# Patient Record
Sex: Female | Born: 1985 | Race: White | Hispanic: Yes | Marital: Single | Smoking: Never smoker
Health system: Southern US, Community
[De-identification: ages and names within clinical notes are randomized; demographics above are authoritative.]

## PROBLEM LIST (undated history)

## (undated) ENCOUNTER — Ambulatory Visit (HOSPITAL_BASED_OUTPATIENT_CLINIC_OR_DEPARTMENT_OTHER): Discharge status: Absent without leave | Payer: Self-pay

## (undated) DIAGNOSIS — Z789 Other specified health status: Secondary | ICD-10-CM

## (undated) HISTORY — PX: NO PAST SURGERIES: SHX2092

---

## 2013-03-28 ENCOUNTER — Other Ambulatory Visit (HOSPITAL_COMMUNITY): Payer: Self-pay | Admitting: Nurse Practitioner

## 2013-03-28 DIAGNOSIS — Z3689 Encounter for other specified antenatal screening: Secondary | ICD-10-CM

## 2013-03-28 LAB — OB RESULTS CONSOLE ANTIBODY SCREEN: Antibody Screen: NEGATIVE

## 2013-03-28 LAB — OB RESULTS CONSOLE RUBELLA ANTIBODY, IGM: RUBELLA: IMMUNE

## 2013-03-28 LAB — OB RESULTS CONSOLE VARICELLA ZOSTER ANTIBODY, IGG: Varicella: IMMUNE

## 2013-03-28 LAB — OB RESULTS CONSOLE RPR: RPR: NONREACTIVE

## 2013-03-28 LAB — OB RESULTS CONSOLE ABO/RH: RH Type: POSITIVE

## 2013-03-28 LAB — OB RESULTS CONSOLE GC/CHLAMYDIA
Chlamydia: NEGATIVE
Gonorrhea: NEGATIVE

## 2013-03-28 LAB — OB RESULTS CONSOLE HIV ANTIBODY (ROUTINE TESTING): HIV: NONREACTIVE

## 2013-03-28 LAB — OB RESULTS CONSOLE HEPATITIS B SURFACE ANTIGEN: HEP B S AG: NEGATIVE

## 2013-04-02 ENCOUNTER — Encounter (HOSPITAL_COMMUNITY): Payer: Self-pay

## 2013-04-02 ENCOUNTER — Ambulatory Visit (HOSPITAL_COMMUNITY): Admission: RE | Admit: 2013-04-02 | Payer: Medicaid Other | Source: Ambulatory Visit

## 2013-04-02 ENCOUNTER — Other Ambulatory Visit (HOSPITAL_COMMUNITY): Payer: Self-pay | Admitting: Nurse Practitioner

## 2013-04-02 ENCOUNTER — Ambulatory Visit (HOSPITAL_COMMUNITY)
Admission: RE | Admit: 2013-04-02 | Discharge: 2013-04-02 | Disposition: A | Discharge status: Home / Self Care | Payer: Medicaid Other | Source: Ambulatory Visit | Attending: Nurse Practitioner | Admitting: Nurse Practitioner

## 2013-04-02 DIAGNOSIS — Z3689 Encounter for other specified antenatal screening: Secondary | ICD-10-CM

## 2013-04-15 ENCOUNTER — Other Ambulatory Visit (HOSPITAL_COMMUNITY): Payer: Self-pay | Admitting: Nurse Practitioner

## 2013-04-15 DIAGNOSIS — Z3689 Encounter for other specified antenatal screening: Secondary | ICD-10-CM

## 2013-04-29 ENCOUNTER — Ambulatory Visit (HOSPITAL_COMMUNITY): Payer: Self-pay | Attending: Nurse Practitioner

## 2013-05-07 ENCOUNTER — Other Ambulatory Visit (HOSPITAL_COMMUNITY): Payer: Self-pay | Admitting: Family

## 2013-05-07 DIAGNOSIS — O3663X3 Maternal care for excessive fetal growth, third trimester, fetus 3: Secondary | ICD-10-CM

## 2013-05-21 ENCOUNTER — Ambulatory Visit (HOSPITAL_COMMUNITY)
Admission: RE | Admit: 2013-05-21 | Discharge: 2013-05-21 | Disposition: A | Discharge status: Home / Self Care | Payer: Medicaid Other | Source: Ambulatory Visit | Attending: Family | Admitting: Family

## 2013-05-21 DIAGNOSIS — O26849 Uterine size-date discrepancy, unspecified trimester: Secondary | ICD-10-CM | POA: Insufficient documentation

## 2013-06-03 LAB — OB RESULTS CONSOLE GC/CHLAMYDIA
Chlamydia: NEGATIVE
Gonorrhea: NEGATIVE

## 2013-06-03 LAB — OB RESULTS CONSOLE GBS: GBS: NEGATIVE

## 2013-06-24 ENCOUNTER — Encounter (HOSPITAL_COMMUNITY): Payer: Self-pay | Admitting: *Deleted

## 2013-06-24 ENCOUNTER — Inpatient Hospital Stay (HOSPITAL_COMMUNITY)
Admission: EM | Admit: 2013-06-24 | Discharge: 2013-06-25 | DRG: 774 | Disposition: A | Discharge status: Home / Self Care | Payer: Medicaid Other | Attending: Family Medicine | Admitting: Family Medicine

## 2013-06-24 DIAGNOSIS — Z3490 Encounter for supervision of normal pregnancy, unspecified, unspecified trimester: Secondary | ICD-10-CM

## 2013-06-24 HISTORY — DX: Other specified health status: Z78.9

## 2013-06-24 LAB — COMPREHENSIVE METABOLIC PANEL
ALT: 22 U/L (ref 0–35)
AST: 25 U/L (ref 0–37)
Albumin: 2.1 g/dL — ABNORMAL LOW (ref 3.5–5.2)
Alkaline Phosphatase: 154 U/L — ABNORMAL HIGH (ref 39–117)
BUN: 4 mg/dL — AB (ref 6–23)
CHLORIDE: 106 meq/L (ref 96–112)
CO2: 20 meq/L (ref 19–32)
CREATININE: 0.51 mg/dL (ref 0.50–1.10)
Calcium: 8.1 mg/dL — ABNORMAL LOW (ref 8.4–10.5)
GLUCOSE: 91 mg/dL (ref 70–99)
Potassium: 3.6 mEq/L — ABNORMAL LOW (ref 3.7–5.3)
Sodium: 139 mEq/L (ref 137–147)
Total Protein: 6 g/dL (ref 6.0–8.3)

## 2013-06-24 LAB — CBC WITH DIFFERENTIAL/PLATELET
Basophils Absolute: 0 10*3/uL (ref 0.0–0.1)
Basophils Relative: 0 % (ref 0–1)
Eosinophils Absolute: 0.1 10*3/uL (ref 0.0–0.7)
Eosinophils Relative: 1 % (ref 0–5)
HEMATOCRIT: 33 % — AB (ref 36.0–46.0)
HEMOGLOBIN: 10.3 g/dL — AB (ref 12.0–15.0)
LYMPHS ABS: 1.9 10*3/uL (ref 0.7–4.0)
LYMPHS PCT: 20 % (ref 12–46)
MCH: 23 pg — ABNORMAL LOW (ref 26.0–34.0)
MCHC: 31.2 g/dL (ref 30.0–36.0)
MCV: 73.7 fL — ABNORMAL LOW (ref 78.0–100.0)
Monocytes Absolute: 0.8 10*3/uL (ref 0.1–1.0)
Monocytes Relative: 9 % (ref 3–12)
NEUTROS ABS: 6.5 10*3/uL (ref 1.7–7.7)
Neutrophils Relative %: 70 % (ref 43–77)
Platelets: 268 10*3/uL (ref 150–400)
RBC: 4.48 MIL/uL (ref 3.87–5.11)
RDW: 17 % — ABNORMAL HIGH (ref 11.5–15.5)
WBC: 9.3 10*3/uL (ref 4.0–10.5)

## 2013-06-24 LAB — TYPE AND SCREEN
ABO/RH(D): O POS
ANTIBODY SCREEN: NEGATIVE

## 2013-06-24 LAB — RPR: RPR Ser Ql: NONREACTIVE

## 2013-06-24 MED ORDER — WITCH HAZEL-GLYCERIN EX PADS: 1.0000 "application " | MEDICATED_PAD | CUTANEOUS | Status: DC | PRN

## 2013-06-24 MED ORDER — SIMETHICONE 80 MG PO CHEW: 80.0000 mg | CHEWABLE_TABLET | ORAL | Status: DC | PRN

## 2013-06-24 MED ORDER — ZOLPIDEM TARTRATE 5 MG PO TABS: 5.0000 mg | ORAL_TABLET | Freq: Every evening | ORAL | Status: DC | PRN

## 2013-06-24 MED ORDER — OXYTOCIN 40 UNITS IN LACTATED RINGERS INFUSION - SIMPLE MED
250.0000 mL/h | INTRAVENOUS | Status: DC
  Administered 2013-06-24: 250 mL/h via INTRAVENOUS
  Filled 2013-06-24: qty 1000

## 2013-06-24 MED ORDER — SENNOSIDES-DOCUSATE SODIUM 8.6-50 MG PO TABS
2.0000 | ORAL_TABLET | ORAL | Status: DC
Start: 2013-06-25 — End: 2013-06-25
  Administered 2013-06-25: 2 via ORAL
  Filled 2013-06-24: qty 2

## 2013-06-24 MED ORDER — FENTANYL CITRATE 0.05 MG/ML IJ SOLN
INTRAMUSCULAR | Status: AC
  Filled 2013-06-24: qty 2

## 2013-06-24 MED ORDER — PRENATAL MULTIVITAMIN CH
1.0000 | ORAL_TABLET | Freq: Every day | ORAL | Status: DC
  Administered 2013-06-24 – 2013-06-25 (×2): 1 via ORAL
  Filled 2013-06-24 (×2): qty 1

## 2013-06-24 MED ORDER — MISOPROSTOL 200 MCG PO TABS
800.0000 ug | ORAL_TABLET | Freq: Once | ORAL | Status: AC
  Administered 2013-06-24: 800 ug via VAGINAL
  Filled 2013-06-24: qty 4

## 2013-06-24 MED ORDER — FENTANYL CITRATE 0.05 MG/ML IJ SOLN
50.0000 ug | Freq: Once | INTRAMUSCULAR | Status: AC
  Administered 2013-06-24: 50 ug via INTRAVENOUS

## 2013-06-24 MED ORDER — OXYCODONE-ACETAMINOPHEN 5-325 MG PO TABS
2.0000 | ORAL_TABLET | Freq: Once | ORAL | Status: AC
  Administered 2013-06-24: 2 via ORAL
  Filled 2013-06-24: qty 2

## 2013-06-24 MED ORDER — DIPHENHYDRAMINE HCL 25 MG PO CAPS: 25.0000 mg | ORAL_CAPSULE | Freq: Four times a day (QID) | ORAL | Status: DC | PRN

## 2013-06-24 MED ORDER — OXYTOCIN 10 UNIT/ML IJ SOLN
INTRAMUSCULAR | Status: DC | PRN
  Administered 2013-06-24: 40 [IU]

## 2013-06-24 MED ORDER — DIBUCAINE 1 % RE OINT
1.0000 | TOPICAL_OINTMENT | RECTAL | Status: DC | PRN
Start: 2013-06-24 — End: 2013-06-25

## 2013-06-24 MED ORDER — LANOLIN HYDROUS EX OINT: TOPICAL_OINTMENT | CUTANEOUS | Status: DC | PRN

## 2013-06-24 MED ORDER — OXYCODONE-ACETAMINOPHEN 5-325 MG PO TABS
1.0000 | ORAL_TABLET | ORAL | Status: DC | PRN
  Administered 2013-06-24 (×2): 1 via ORAL
  Administered 2013-06-24: 2 via ORAL
  Administered 2013-06-25: 1 via ORAL
  Filled 2013-06-24 (×3): qty 1
  Filled 2013-06-24: qty 2

## 2013-06-24 MED ORDER — BENZOCAINE-MENTHOL 20-0.5 % EX AERO: 1.0000 "application " | INHALATION_SPRAY | CUTANEOUS | Status: DC | PRN

## 2013-06-24 MED ORDER — ONDANSETRON HCL 4 MG/2ML IJ SOLN
4.0000 mg | INTRAMUSCULAR | Status: DC | PRN
Start: 2013-06-24 — End: 2013-06-25

## 2013-06-24 MED ORDER — OXYTOCIN 40 UNITS IN LACTATED RINGERS INFUSION - SIMPLE MED
62.5000 mL/h | INTRAVENOUS | Status: DC
  Administered 2013-06-24: 62.5 mL/h via INTRAVENOUS
  Filled 2013-06-24 (×2): qty 1000

## 2013-06-24 MED ORDER — TETANUS-DIPHTH-ACELL PERTUSSIS 5-2.5-18.5 LF-MCG/0.5 IM SUSP
0.5000 mL | Freq: Once | INTRAMUSCULAR | Status: AC
  Administered 2013-06-24: 0.5 mL via INTRAMUSCULAR
  Filled 2013-06-24: qty 0.5

## 2013-06-24 MED ORDER — ONDANSETRON HCL 4 MG PO TABS: 4.0000 mg | ORAL_TABLET | ORAL | Status: DC | PRN

## 2013-06-24 MED ORDER — IBUPROFEN 600 MG PO TABS
600.0000 mg | ORAL_TABLET | Freq: Four times a day (QID) | ORAL | Status: DC
  Administered 2013-06-24 – 2013-06-25 (×5): 600 mg via ORAL
  Filled 2013-06-24 (×5): qty 1

## 2013-06-24 NOTE — ED Notes (Signed)
Patient leaving for [name] at this time.

## 2013-06-24 NOTE — ED Notes (Signed)
Baby born at this time by Dr Jodi MourningZavitz.

## 2013-06-24 NOTE — Lactation Note (Signed)
This note was copied from the chart of Rebecca English. Lactation Consultation Note  Patient Name: Rebecca English Today's Date: 06/24/2013 Reason for consult: Initial assessment Marly, the Spanish Interpreter present for visit. Mom reports baby is nursing well, denies questions or concerns. Basics reviewed. Lactation brochure left for review. Advised of OP services and support group. Mom reports she plans to breastfeed only till returning to work. Advised to ask for assist as needed.   Maternal Data Infant to breast within first hour of birth: Yes Has patient been taught Hand Expression?: Yes Does the patient have breastfeeding experience prior to this delivery?: Yes  Feeding Feeding Type: Breast Fed Length of feed: 10 min  LATCH Score/Interventions                      Lactation Tools Discussed/Used     Consult Status Consult Status: PRN Date: 06/25/13 Follow-up type: In-patient    Alfred LevinsGranger, Damarkus Balis Ann 06/24/2013, 7:05 PM

## 2013-06-24 NOTE — H&P (Signed)
Attestation of Attending Supervision of Advanced Practitioner (PA/CNM/NP): Evaluation and management procedures were performed by the Advanced Practitioner under my supervision and collaboration.  I have reviewed the Advanced Practitioner's note and chart, and I agree with the management and plan.  Reva BoresPRATT,Greenly Rarick S, MD [facility] Attending 06/24/2013 11:20 AM

## 2013-06-24 NOTE — MAU Note (Signed)
Off and on contractions since Friday, started this morning 0330.

## 2013-06-24 NOTE — ED Notes (Signed)
Patient coming through triage in active labor.  Patient is non english speaking, 4th child.  Patient on fetal monitor, baby HR of 160.

## 2013-06-24 NOTE — MAU Note (Signed)
[facility]. Pt arrived via carelink. [facility].  2nd IV started by carelink.  Bleeding controlled on arrival. C/o cramping in abd.

## 2013-06-24 NOTE — H&P (Signed)
Rebecca PetersGloria Mendoza English is a 28 y.o. female [phone] presenting at 719w1d [facility]. Following delivery patient had moderate- heavy postpartum bleeding; 80 units total of pitocin given via IV and cytotec given 800 mcg rectal. Patient presented to [name] in stable condition.   [facility] with late entry at 28wks. Hx LGA with P1.   Maternal Medical History:  Reason for admission: Nausea. See above ([facility])  Prenatal complications: no prenatal complications Prenatal Complications - Diabetes: none.    OB History   Grav Para Term Preterm Abortions TAB SAB Ect Mult Living   5 4 4  0 1 0 1 0 0 5     Past Medical History  Diagnosis Date  . Medical history non-contributory    Past Surgical History  Procedure Laterality Date  . No past surgeries     Family History: family history includes Diabetes in her paternal grandmother. There is no history of Hearing loss. Social History:  reports that she has never smoked. She has never used smokeless tobacco. She reports that she does not drink alcohol or use illicit drugs.   Prenatal Transfer Tool  Maternal Diabetes: No Genetic Screening: Declined Maternal Ultrasounds/Referrals: Normal Fetal Ultrasounds or other Referrals:  None Maternal Substance Abuse:  No Significant Maternal Medications:  None Significant Maternal Lab Results:  Lab values include: Group B Strep negative Other Comments:  None  Review of Systems  Constitutional: Negative for fever.  Respiratory: Negative for cough.   Gastrointestinal: Negative for nausea and vomiting.  Genitourinary: Negative for dysuria.  Neurological: Negative for headaches.      Blood pressure 121/77, pulse 84, temperature 98.9 F (37.2 C), temperature source Oral, resp. rate 18, SpO2 100.00%, unknown if currently breastfeeding. Maternal Exam:  Uterine Assessment: N/a   Abdomen: Patient reports no abdominal tenderness. n/a  Introitus:  deferred  Pelvis: adequate for delivery.   Cervix: N/a     Fetal Exam Fetal Monitor Review: n/a  Fetal State Assessment: n/a  Physical Exam  Constitutional: She is oriented to person, place, and time. She appears well-developed and well-nourished. No distress.  HENT:  Head: Normocephalic.  Neck: Normal range of motion. No thyromegaly present.  Cardiovascular: Normal rate and regular rhythm.   Respiratory: Effort normal and breath sounds normal.  GI:  Early PP. FF  Musculoskeletal: Normal range of motion.  Neurological: She is alert and oriented to person, place, and time.  Skin: Skin is warm and dry.    Prenatal labs: ABO, Rh: --/--/O POS (01/19 0730) Antibody: NEG (01/19 0730) Rubella: Immune (10/23 0000) RPR: Nonreactive (10/23 0000)  HBsAg: Negative (10/23 0000)  HIV: Non-reactive (10/23 0000)  GBS: Negative (12/29 0000)  1 hr glucola  86  Assessment/Plan: [id number] [facility]  PPD#0 stable   Red Mandt 06/24/2013, 10:08 AM

## 2013-06-24 NOTE — ED Notes (Signed)
Report given to Renae FicklePaul, Charity fundraiserN from [facility].  Patient transferred to [name] at this time.

## 2013-06-24 NOTE — ED Notes (Signed)
OB RR RN in room at this time.

## 2013-06-24 NOTE — Progress Notes (Signed)
This was with [phone] check

## 2013-06-24 NOTE — MAU Provider Note (Signed)
Attestation of Attending Supervision of Advanced Practitioner (PA/CNM/NP): Evaluation and management procedures were performed by the Advanced Practitioner under my supervision and collaboration.  I have reviewed the Advanced Practitioner's note and chart, and I agree with the management and plan.  Myli Pae S, MD [facility] Attending 06/24/2013 11:20 AM   

## 2013-06-24 NOTE — MAU Provider Note (Signed)
Subjective:  Ms. Rebecca English is a 28 y.o. female [phone]; pt presents [name]. Following delivery patient had moderate- heavy postpartum bleeding; 80 units total of pitocin given via IV and cytotec given 800 mcg rectal [name]. Patient presented to [name] in stable condition.    Objective:  GENERAL: Well-developed, well-nourished female in no acute distress.  HEENT: Normocephalic, atraumatic.   LUNGS: Effort normal HEART: Regular rate  ABDOMEN: fundus below umbilicus, soft; minimal vaginal bleeding with palpation  SKIN: Warm, dry, Pale and without erythema PSYCH: Normal mood and affect, spanish speaking   Filed Vitals:   06/24/13 0859  BP: 121/77  Pulse: 84  Temp:   Resp: 18    MDM: 2 percocet given in [name] IV intact  D. Poe CNM notified at 0730 of patient arrival.   A:  Status post vaginal delivery at [phone]; patient to be admitted to mother baby; infant to nursery.   Iona HansenJennifer Irene Gaylon Bentz, NP. 06/24/2013 10:11 AM

## 2013-06-24 NOTE — ED Provider Notes (Signed)
CSN: [id number]     Arrival date & time 06/24/13  1191 History   First MD Initiated Contact with Patient 06/24/13 [phone]     Chief Complaint  Patient presents with  . Laboring   (Consider location/radiation/quality/duration/timing/severity/associated sxs/prior Treatment) HPI Comments: 28 yo female Spanish speaking presents in active labor in our ED, no known pregnancy issues, she has had prenatal care locally, unsure her OB name.  No bleeding or fluid felt on arrival.  Unsure GBS.  Contractions regular, pressure, nothing improves.  Husband with patient.  No issues with previous vaginal deliveries.   The history is provided by the patient.    Past Medical History  Diagnosis Date  . Medical history non-contributory    Past Surgical History  Procedure Laterality Date  . No past surgeries     Family History  Problem Relation Age of Onset  . Diabetes Paternal Grandmother   . Hearing loss Neg Hx    History  Substance Use Topics  . Smoking status: Never Smoker   . Smokeless tobacco: Never Used  . Alcohol Use: No   OB History   Grav Para Term Preterm Abortions TAB SAB Ect Mult Living   5 4 4  0 1 0 1 0 0 5     Review of Systems  Constitutional: Negative for fever and chills.  HENT: Negative for congestion.   Eyes: Negative for visual disturbance.  Respiratory: Negative for shortness of breath.   Cardiovascular: Negative for chest pain.  Gastrointestinal: Positive for nausea and abdominal pain. Negative for vomiting.  Genitourinary: Negative for dysuria and flank pain.  Musculoskeletal: Positive for back pain. Negative for neck stiffness.  Skin: Negative for rash.  Neurological: Negative for light-headedness and headaches.    Allergies  Review of patient's allergies indicates no known allergies.  Home Medications  No current outpatient prescriptions on file. BP 121/77  Pulse 84  Temp(Src) 98.9 F (37.2 C) (Oral)  Resp 18  SpO2 100% Physical Exam  Nursing note and  vitals reviewed. Constitutional: She is oriented to person, place, and time. She appears well-developed and well-nourished.  HENT:  Head: Normocephalic and atraumatic.  Eyes: Right eye exhibits no discharge. Left eye exhibits no discharge.  Neck: Normal range of motion. Neck supple. No tracheal deviation present.  Cardiovascular: Normal rate and regular rhythm.   Pulmonary/Chest: Effort normal and breath sounds normal.  Abdominal: Soft. She exhibits no distension. There is tenderness (gravid, contractions regularly felt). There is no guarding.  Genitourinary:  Mild fluid/ blood from vaginal opening,  Fetal head palpated initially  Musculoskeletal: She exhibits edema (mild ankles bilateral).  Neurological: She is alert and oriented to person, place, and time.  Skin: Skin is warm. No rash noted.  Psychiatric: Her mood appears anxious.    ED Course  Procedures (including critical care time) Labs Review Labs Reviewed  CBC WITH DIFFERENTIAL - Abnormal; Notable for the following:    Hemoglobin 10.3 (*)    HCT 33.0 (*)    MCV 73.7 (*)    MCH 23.0 (*)    RDW 17.0 (*)    All other components within normal limits  COMPREHENSIVE METABOLIC PANEL - Abnormal; Notable for the following:    Potassium 3.6 (*)    BUN 4 (*)    Calcium 8.1 (*)    Albumin 2.1 (*)    Alkaline Phosphatase 154 (*)    Total Bilirubin <0.2 (*)    All other components within normal limits  TYPE AND SCREEN  Imaging Review No results found.  EKG Interpretation    Date/Time:    Ventricular Rate:    PR Interval:    QRS Duration:   QT Interval:    QTC Calculation:   R Axis:     Text Interpretation:              MDM   1. Vaginal delivery   2. Pregnancy    Pt presented in active labor. Good FHR on monitor. IV fluids, access and OB nurses/ physician paged.  I personally delivered healthy appearing baby girl in the ED, vertex, short Labor.   OB nurses arrived, assisting with Pitocin, post  delivery care and newborn care.  Rechecked both multiple times.  Placental delivered, cytotec given for bleeding. Manual removal of blood clots.   [facility] he accepted.       Enid SkeensJoshua M Paisley Grajeda, MD 06/24/13 [phone]

## 2013-06-24 NOTE — ED Notes (Signed)
Placenta delivered at this time.

## 2013-06-24 NOTE — Progress Notes (Signed)
RROB called to come to [facility] for actively laboring pt at [phone].  Called again at [phone] saying that pt had delivered, RROB [facility] at [phone].  RROB spoke with Dr Despina HiddenEure (attending OB), told of pt delivery at [phone].  Dr Despina HiddenEure [facility]  Dr Despina HiddenEure spoke with Dr Jodi MourningZavitz about plan of care, [facility].  Newborn vigorous with apgars 8/9, but temp=96.8axillary and was placed under warmer with temperature probe applied until carelink transferred newborn to [facility] @0730 . While waiting on Carelink for mother of baby, placenta was delivered at 0707,  by K.Loxley Schmale,RNC,RROB.  Pt's bleeding large with clots after delivery of placenta, pitocin 40mu in 1000ml of LR at bolus rate, RROB performing constant fundal massage.  Cytotec 800mcg placed in rectum by Verda CuminsMary Early,RNC.  Pt transferred to [facility] via carelink at 0739, bleeding small and fundus firm at u-1.

## 2013-06-25 LAB — CBC
HEMATOCRIT: 29.2 % — AB (ref 36.0–46.0)
HEMOGLOBIN: 9.1 g/dL — AB (ref 12.0–15.0)
MCH: 22.8 pg — ABNORMAL LOW (ref 26.0–34.0)
MCHC: 31.2 g/dL (ref 30.0–36.0)
MCV: 73.2 fL — ABNORMAL LOW (ref 78.0–100.0)
Platelets: 260 10*3/uL (ref 150–400)
RBC: 3.99 MIL/uL (ref 3.87–5.11)
RDW: 17.3 % — ABNORMAL HIGH (ref 11.5–15.5)
WBC: 9.6 10*3/uL (ref 4.0–10.5)

## 2013-06-25 MED ORDER — IBUPROFEN 600 MG PO TABS: 600.0000 mg | ORAL_TABLET | Freq: Four times a day (QID) | ORAL | Status: DC

## 2013-06-25 NOTE — Discharge Summary (Signed)
Attestation of Attending Supervision of Fellow: Evaluation and management procedures were performed by the Fellow under my supervision and collaboration.  I have reviewed the Fellow's note and chart, and I agree with the management and plan.    

## 2013-06-25 NOTE — Clinical Social Work Maternal (Signed)
Clinical Social Work Department  PSYCHOSOCIAL ASSESSMENT - MATERNAL/CHILD  06/25/2013  Patient: Rebecca English,Rebecca English Account Number: [id number] Admit Date: 06/24/2013  Childs Name:  Rebecca English   Clinical Social Worker: Peytin Dechert, LCSW Date/Time: 06/25/2013 01:42 PM  Date Referred: 06/25/2013  Referral source   Physician    Referred reason   Other - See comment   Other referral source:  I: FAMILY / HOME ENVIRONMENT  Child's legal guardian:  Guardian - Name  Guardian - Age  Guardian - Address   Rebecca English  27  [address]; [place], [place] [address]   Rebecca English  24    Other household support members/support persons  Name  Relationship  DOB    DAUGHTER  09/2001    DAUGHTER  11/2005    DAUGHTER  06/2010   Other support:  Family   II PSYCHOSOCIAL DATA  Information Source: Patient Interview  Financial and Community Resources  Employment:  [employer]   Financial resources: Self Pay  If Medicaid - County:  School / Grade:  Maternity Care Coordinator / Child Services Coordination / Early Interventions: Cultural issues impacting care:  III STRENGTHS  Strengths   Adequate Resources   Home prepared for Child (including basic supplies)   Supportive family/friends   Strength comment:  IV RISK FACTORS AND CURRENT PROBLEMS  Current Problem: YES  Risk Factor & Current Problem  Patient Issue  Family Issue  Risk Factor / Current Problem Comment   Mental Illness  Y  N  Hx of Mild PP depression   V SOCIAL WORK ASSESSMENT  CSW referral received to assess pt's history of "mild" PP depression. 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 She remembers "crying a lot" but did not seek medical attention, since she thought it was normal. She denies any history of SI/HI. She denies any depression symptoms since then & reports feeling fine now. FOB at the bedside & supportive, per pt. The couple does not live together  due to conflict between pt's 11 year old daughter & FOB. Pt explained that FOB directs her daughter to follow rules & respect the pt & therefore her daughter does not like him. She denies any form of abuse. She has all the necessary supplies for the infant & good family support. CSW discussed signs/symptoms of PP depression & encouraged pt to seek medical attention if symptoms become unmanageable.   VI SOCIAL WORK PLAN  Social Work Plan   No Further Intervention Required / No Barriers to Discharge   Type of pt/family education:  If child protective services report - county:  If child protective services report - date:  Information/referral to community resources comment:  Other social work plan:       Clinical Social Work Department PSYCHOSOCIAL ASSESSMENT - MATERNAL/CHILD 06/25/2013  Patient:  Rebecca English, Rebecca English  Account Number:  [id number]  Admit Date:  06/24/2013  Rebecca English Name:   Rebecca English    Clinical Social Worker:  Rebecca Putnam, LCSW   Date/Time:  06/25/2013 01:42 PM  Date Referred:  06/25/2013   Referral source  Physician     Referred reason  Other - See comment   Other referral source:    I:  FAMILY / HOME ENVIRONMENT Child's legal guardian:    Guardian - Name Guardian - Age Guardian - Address  Rebecca English [address]; [place], [place] [address]  Rebecca English 24    Other household support members/support persons Name Relationship DOB   DAUGHTER 09/2001   DAUGHTER 11/2005   DAUGHTER 06/2010   Other support:   Family    II  PSYCHOSOCIAL DATA Information Source:  Patient Interview  Event organiser Employment:   Ecologist resources:  Self Pay If Medicaid - County:    School / Grade:   Maternity Care Coordinator / Child Services Coordination / Early Interventions:  Cultural issues impacting care:    III  STRENGTHS Strengths  Adequate Resources  Home prepared for Child (including basic supplies)  Supportive family/friends   Strength comment:    IV  RISK FACTORS AND CURRENT PROBLEMS Current Problem:  YES   Risk Factor & Current Problem Patient Issue Family Issue Risk Factor / Current Problem Comment  Mental Illness Y N Hx of Mild PP depression    V  SOCIAL WORK ASSESSMENT CSW referral received to assess pt's history of "mild" PP depression.  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  She remembers "crying a lot" but did not seek medical attention, since she thought it was normal.  She denies any history of SI/HI.  She denies any depression symptoms since then & reports feeling fine now.  FOB at the bedside & supportive, per pt.   The couple does not live together due to conflict between pt's 63 year old daughter & FOB.  Pt explained that FOB directs her daughter to follow rules & respect the pt & therefore her daughter does not like him.  She denies any form of abuse.  She has all the necessary supplies for the infant & good family support.  CSW discussed signs/symptoms of PP depression & encouraged pt to seek medical attention if symptoms become unmanageable.      VI SOCIAL WORK PLAN Social Work Plan  No Further Intervention Required / No Barriers to Discharge   Type of pt/family education:   If child protective services report - county:   If child protective services report - date:   Information/referral to community resources comment:   Other social work plan:

## 2013-06-25 NOTE — Discharge Summary (Signed)
Obstetric Discharge Summary Reason for Admission: Vaginal delivery at ED at [facility] Prenatal Procedures: none Intrapartum Procedures: spontaneous vaginal delivery Postpartum Procedures: none Complications-Operative and Postpartum: none  Hospital Course: [facility] at [facility] No acute issues post partum. Doing well and meeting milestones. Pt wants nexplanon and is breast feeding. Discharged home  Filed Vitals:   06/25/13 0545  BP: 100/64  Pulse: 77  Temp: 98.5 F (36.9 C)  Resp: 18   VSS NAD Firm appropriately tender Neg homans sign.  Delivery Note At 6:41 AM a viable female was delivered via Vaginal, Spontaneous Delivery (Presentation: ;  ).  APGAR: 8, 9; weight 7 lb 0.2 oz (3181 g).   Placenta status: Intact, Spontaneous.  Cord: 3 vessels with the following complications: None.  Anesthesia: None  Episiotomy: None Lacerations: None Est. Blood Loss (mL): 500  Mom to postpartum.  [facility].  Tawana ScaleODOM, Amaan Meyer RYAN 06/25/2013, 10:42 AM     H/H: Lab Results  Component Value Date/Time   HGB 9.1* 06/25/2013  6:37 AM   HCT 29.2* 06/25/2013  6:37 AM      Discharge Diagnoses: Term Pregnancy-delivered  Discharge Information: Date: 12/16/2010 Activity: pelvic rest Diet: routine  Medications: PNV and motrin Breast feeding:  Yes Condition: stable Instructions: refer to handout Discharge to: home      Medication List         ibuprofen 600 MG tablet  Commonly known as:  ADVIL,MOTRIN  Take 1 tablet (600 mg total) by mouth every 6 (six) hours.     prenatal multivitamin Tabs tablet  Take 1 tablet by mouth daily at 12 noon.           Follow-up Information   [facility] [place]. Schedule an appointment as soon as possible for a visit in 5 weeks.   Contact information:   [address] [place] [phone] [phone]      Tymar Polyak RYAN 06/25/2013,10:42  AM

## 2013-06-25 NOTE — Discharge Instructions (Signed)
Parto vaginal (Vaginal Delivery) Durante el parto, el mdico la ayudar a dar a luz a su beb. En el parto vaginal, deber pujar para que el beb salga por la vagina. Sin embargo, antes de que pueda sacar al beb, es necesario que ocurran ciertas cosas. La abertura del tero (cuello del tero) tiene que ablandarse, hacerse ms delgado y abrirse (dilatar) hasta que llegue a 10 cm. Adems, el beb tiene que bajar desde el tero a la vagina.  SIGNOS DE TRABAJO DE PARTO  El mdico tendr primero que asegurarse de que usted est en trabajo de parto. Algunos signos son:   Eliminar lo que se llama tapn mucoso antes del inicio del trabajo de parto. Este es una pequea cantidad de mucosidad teida con sangre.  Tener contracciones uterinas regulares y dolorosas.  El tiempo entre las contracciones debe acortarse.  Las molestias y el dolor se harn ms intensos gradualmente.  El dolor de las contracciones empeora al caminar y no se alivia con el reposo.  El cuello del tero se hace mas delgado (se borra) y se dilata. ANTES DEL PARTO Una vez que se inicie el trabajo de parto y sea admitida en el hospital o sanatorio, el mdico podr hacer lo siguiente:   Realizar un examen fsico.  Controlar si hay complicaciones relacionadas con el trabajo de parto.  Verificar su presin arterial, temperatura y pulso y la frecuencia cardaca (signos vitales).  Determinar si se ha roto el saco amnitico y cundo ha ocurrido.  Realizar un examen vaginal (utilizando un guante estril y un lubricante) para determinar:  La posicin (presentacin) del beb. El beb se presenta con la cabeza primero (vertex) en el canal de parto (vagina), o estn los pies o las nalgas primero (de nalgas)?  El nivel (estacin) de la cabeza del beb dentro del canal de parto.  El borramiento y la dilatacin del cuello uterino.  El monitor fetal electrnico generalmente se coloca sobre el abdomen al llegar. Se utiliza para controlar  las contracciones y la frecuencia cardaca del beb.  Cuando el monitor est en el abdomen (monitor fetal externo), slo toma la frecuencia y la duracin de las contracciones. No informa acerca de la intensidad de las contracciones.  Si el mdico necesita saber exactamente la intensidad de las contracciones o cul es la frecuencia cardaca del beb, colocar un monitor interno en la vagina y el tero. El mdico comentar los riesgos y los beneficios de usar un monitor interno y le pedir autorizacin antes de colocar el dispositivo.  El monitoreo fetal continuo ser necesario si le han aplicado una epidural, si le administran ciertos medicamentos (como oxitocina) y si tiene complicaciones del embarazo o del trabajo de parto.  Podrn colocarle una va intravenosa en una vena del brazo para suministrarle lquidos y medicamentos, si es necesario. TRES ETAPAS DEL TRABAJO DE PARTO Y EL PARTO El trabajo de parto y el parto normales se dividen en tres etapas. Primera etapa Esta etapa comienza cuando comienzan las contracciones regulares y el cuello comienza a borrarse y dilatarse. Finaliza cuando el cuello est completamente abierto (completamente dilatado). La primera etapa es la etapa ms larga del trabajo de parto y puede durar desde 3 horas a 15 horas.  Algunos mtodos estn disponibles para ayudar con el dolor del parto. Usted y su mdico decidirn qu opcin es la mejor para usted. Las opciones incluyen:   Medicamentos narcticos. Estos son medicamentos fuertes que usted puede recibir a travs de una va intravenosa o como   inyeccin en el msculo. Estos medicamentos alivian el dolor pero no hacen que desaparezca completamente.  Epidural. Se administra un medicamento a travs de un tubo delgado que se inserta en la espalda. El medicamento adormece la parte inferior del cuerpo y evita el dolor en esa zona.  Bloqueo paracervical Es una inyeccin de un anestsico en cada lado del cuello  uterino.  Usted podr pedir un parto natural, que implica que no se usen analgsicos ni epidural durante el parto y el trabajo de parto. En cambio, podr tener otro tipo de ayuda como ejercicios respiratorios para hacer frente al dolor. Segunda etapa La segunda etapa del trabajo de parto comienza cuando el cuello se ha dilatado completamente a 10 cm. Contina hasta que usted puja al beb hacia abajo, por el canal de parto, y el beb nace. Esta etapa puede durar slo algunos minutos o algunas horas.  La posicin del la cabeza del beb a medida que pasa por el canal de parto, es informada como un nmero, llamado estacin. Si la cabeza del beb no ha iniciado su descenso, la estacin se describe como que est en menos 3 ( 3). Cuando la cabeza del beb est en la estacin cero, est en el medio del canal de parto y se encaja en la pelvis. La estacin en la que se encuentra el beb indica el progreso de la segunda etapa del trabajo de parto.  Cuando el beb nace, el mdico lo sostendr con la cabeza hacia abajo para evitar que el lquido amnitico, el moco y la sangre entren en los pulmones del beb. La boca y la nariz del beb podrn ser succionadas con un pequeo bulbo para retirar todo lquido adicional.  El mdico podr colocar al beb sobre su estmago. Es importante evitar que el beb tome fro. Para hacerlo, el mdico secar al beb, lo colocar directamente sobre su piel, (sin mantas entre usted y el beb) y lo cubrir con mantas secas y tibias.  Se corta el cordn umbilical. Tercera etapa Durante la tercera etapa del trabajo de parto, el mdico sacar la placenta (alumbramiento) y se asegurar de que el sangrado est controlado. La salida de la placenta generalmente demora 5 minutos pero puede tardar hasta 30 minutos. Luego de la salida de la placenta, le darn un medicamento por va intravenosa o inyectable para ayudar a contraer el tero y controlar el sangrado. Si planea amamantar al beb, puede  intentar en este momento. Luego de la salida de la placenta, el tero debe contraerse y quedar muy firme. Si el tero no queda firme, el mdico lo masajear. Esto es importante debido a que la contraccin del tero ayuda a cortar el sangrado en el sitio en que la placenta estaba unida al tero. Si el tero no se contrae adecuadamente ni permanece firme, podr causar un sangrado abundante. Si hay mucho sangrado, podrn darle medicamentos para contraer el tero y detener el sangrado.  Document Released: 05/05/2008 Document Revised: 01/23/2013 [employer] Patient Information 2014 [employer]  

## 2013-06-25 NOTE — Progress Notes (Signed)
UR completed 

## 2014-04-07 ENCOUNTER — Encounter (HOSPITAL_COMMUNITY): Payer: Self-pay | Admitting: *Deleted

## 2014-10-20 IMAGING — US US OB COMP +14 WK
1 series · 12 of 28 positions shown · non-contrast
Comparison: none

[Series 1: us ob comp +14 wk · 12 of 82 slices shown]
[im 4/82]
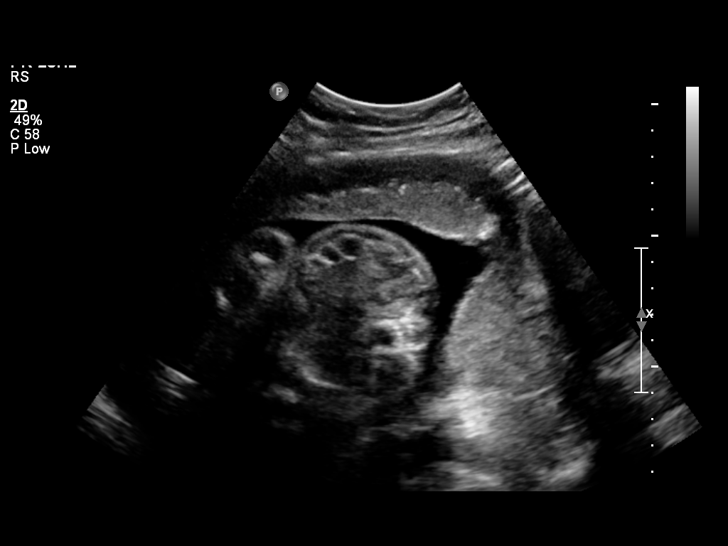
[im 10/82]
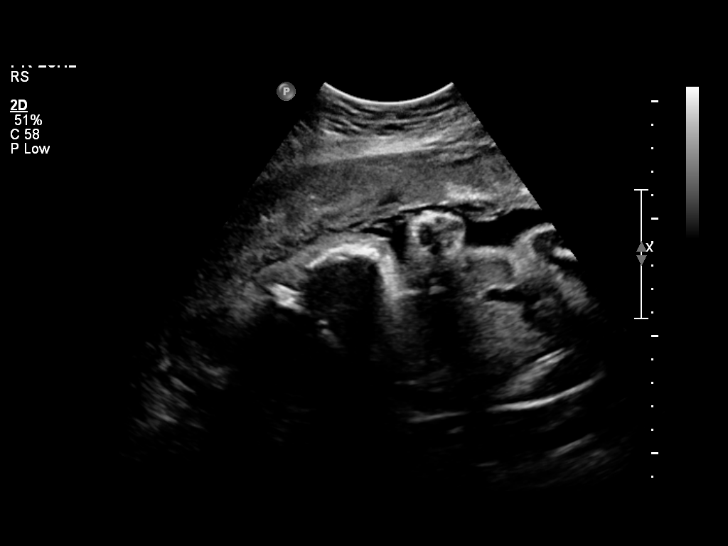
[im 16/82]
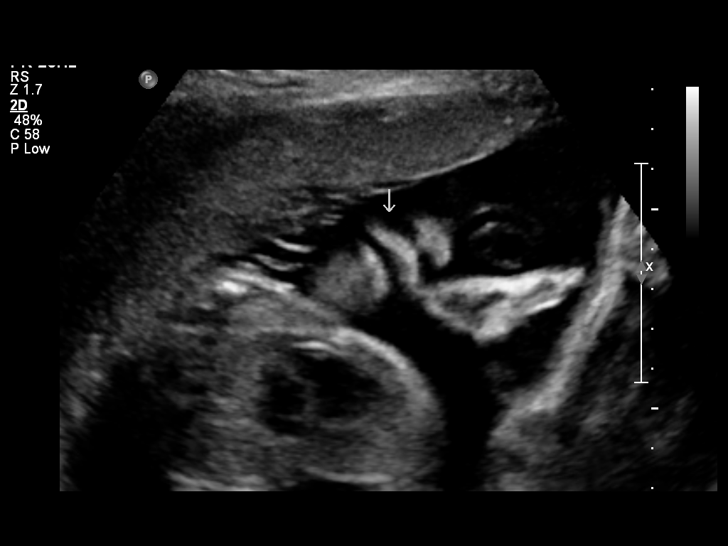
[im 25/82]
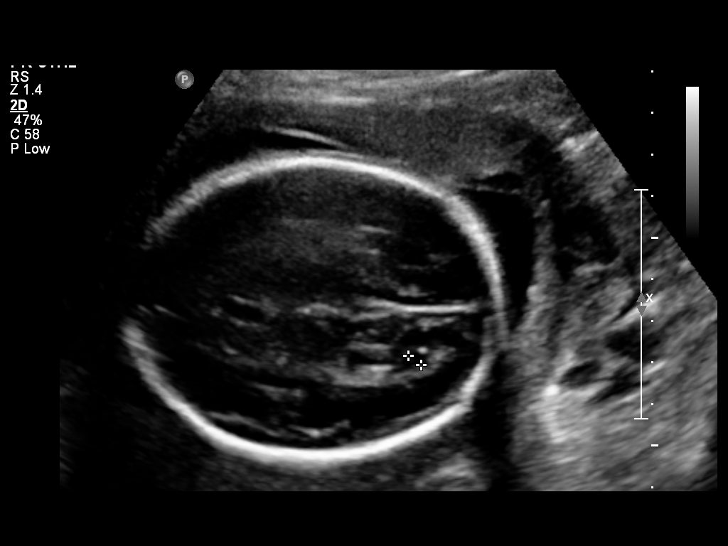
[im 31/82]
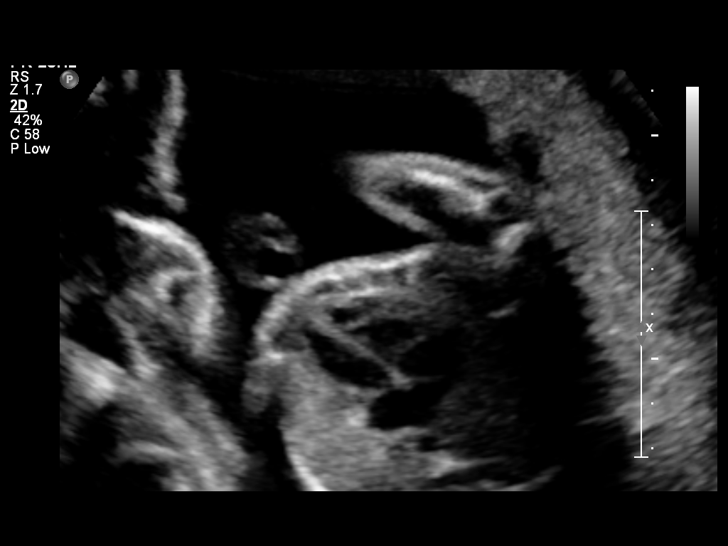
[im 37/82]
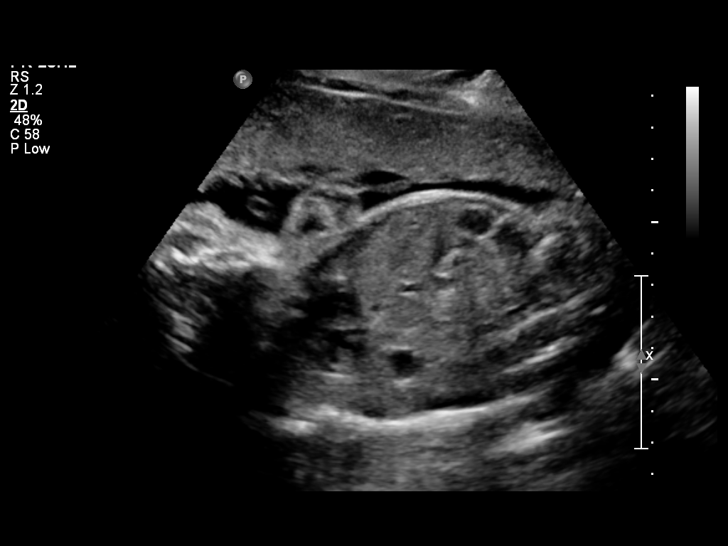
[im 46/82]
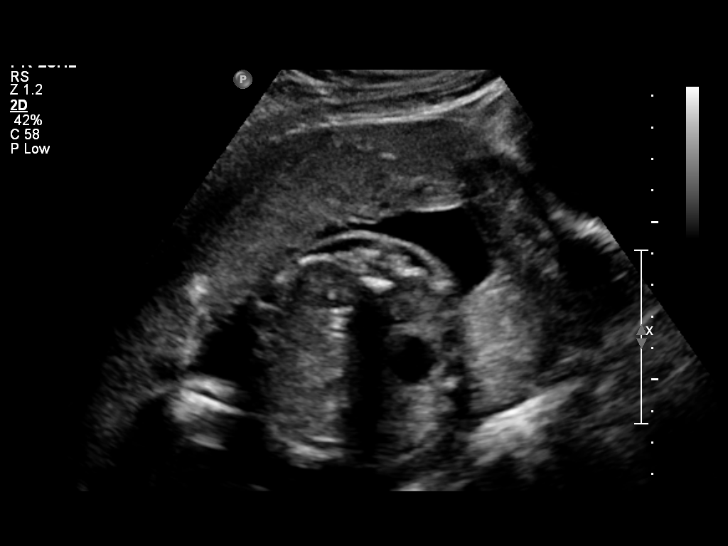
[im 52/82]
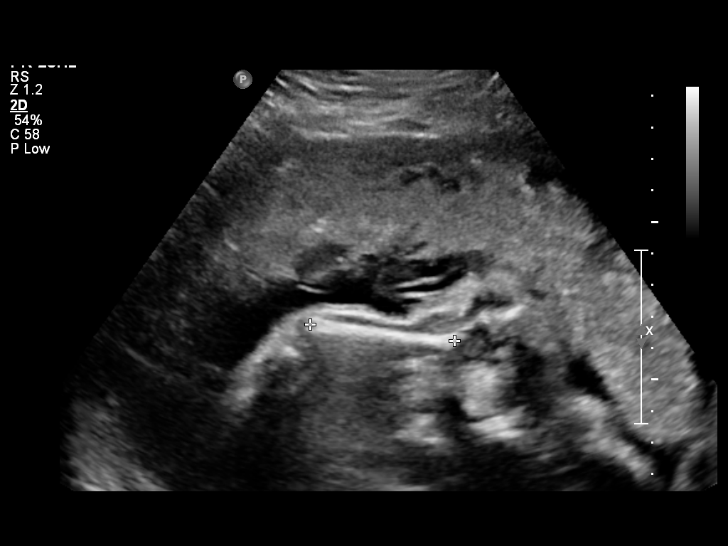
[im 58/82]
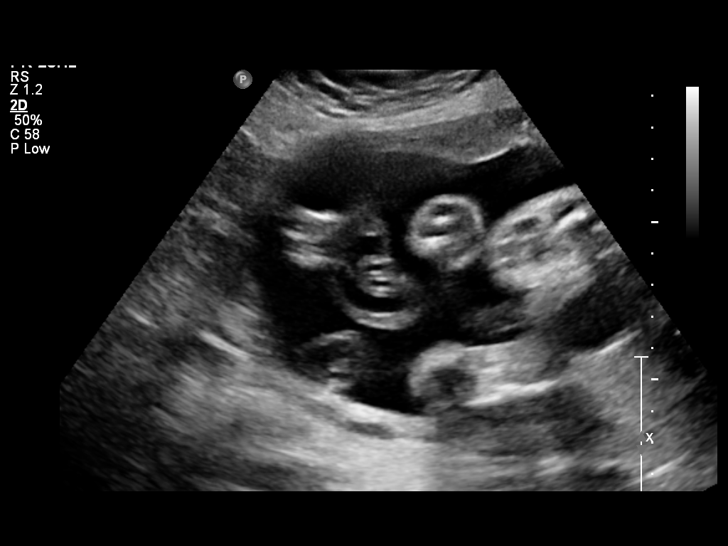
[im 67/82]
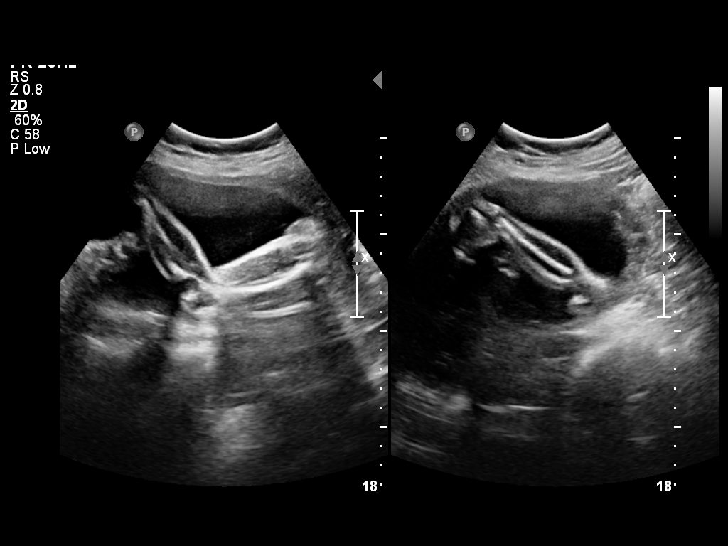
[im 73/82]
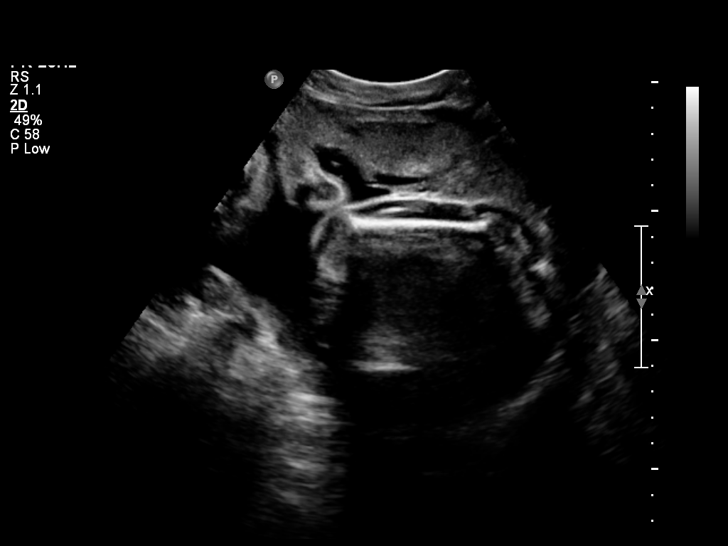
[im 79/82]
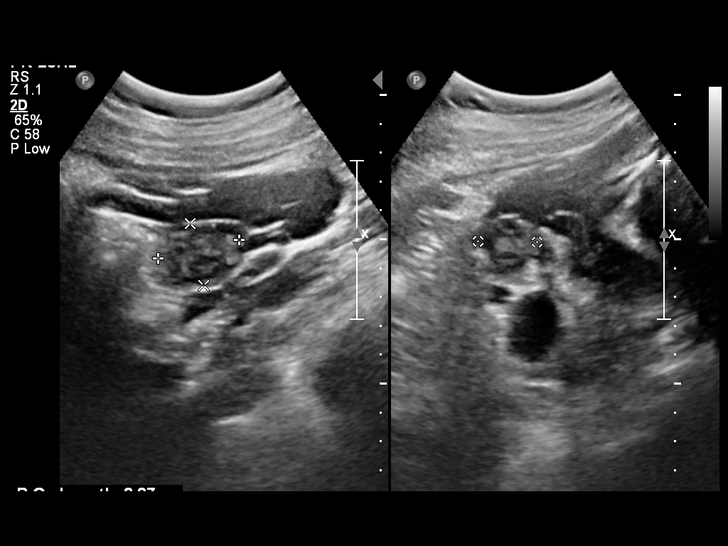

[12 of 28 positions shown; findings below may reference images not displayed]

OBSTETRICS REPORT
                      (Signed Final 04/02/2013 [DATE])

             PENELAS

Service(s) Provided

 US OB COMP + 14 WK                                    76805.1
Indications

 Basic anatomic survey
Fetal Evaluation

 Num Of Fetuses:    1
 Preg. Location:    Intrauterine
 Cardiac Activity:  Observed
 Presentation:      Transverse, head to
                    maternal right
 Placenta:          Anterior, above cervical os
 P. Cord            Visualized, central
 Insertion:

 Amniotic Fluid
 AFI FV:      Subjectively within normal limits
 AFI Sum:     16.56   cm       61  %Tile     Larg Pckt:    4.38  cm
 RUQ:   4.38    cm   RLQ:    4.16   cm    LUQ:   3.97    cm   LLQ:    4.05   cm
Biometry

 BPD:     69.9  mm     G. Age:  28w 0d                CI:         79.2   70 - 86
 OFD:     88.3  mm                                    FL/HC:      21.0   18.8 -

 HC:     254.9  mm     G. Age:  27w 5d       14  %    HC/AC:      1.07   1.05 -

 AC:     238.3  mm     G. Age:  28w 1d       46  %    FL/BPD:     76.5   71 - 87
 FL:      53.5  mm     G. Age:  28w 3d       46  %    FL/AC:      22.5   20 - 24
 HUM:     47.2  mm     G. Age:  27w 5d       41  %

 Est. FW:    3376  gm    2 lb 10 oz      54  %
Gestational Age

 LMP:           35w 0d        Date:  07/31/12                 EDD:   05/07/13
 U/S Today:     28w 0d                                        EDD:   06/25/13
 Best:          28w 0d     Det. By:  U/S (04/02/13)           EDD:   06/25/13
Anatomy
 Cranium:          Appears normal         Aortic Arch:      Appears normal
 Fetal Cavum:      Appears normal         Ductal Arch:      Appears normal
 Ventricles:       Appears normal         Diaphragm:        Appears normal
 Choroid Plexus:   Appears normal         Stomach:          Appears normal, left
                                                            sided
 Cerebellum:       Appears normal         Abdomen:          Appears normal
 Posterior Fossa:  Appears normal         Abdominal Wall:   Appears nml (cord
                                                            insert, abd wall)
 Nuchal Fold:      Not applicable (>20    Cord Vessels:     Appears normal (3
                   wks GA)                                  vessel cord)
 Face:             Appears normal         Kidneys:          Appear normal
                   (orbits and profile)
 Lips:             Appears normal         Bladder:          Appears normal
 Palate:           Not well visualized    Spine:            Appears normal
 Heart:            Appears normal         Lower             Appears normal
                   (4CH, axis, and        Extremities:
                   situs)
 RVOT:             Appears normal         Upper             Appears normal
                                          Extremities:
 LVOT:             Appears normal

 Other:  Female gender. Heels visualized. Technically difficult due to
         advanced GA and fetal position.
Targeted Anatomy

 Fetal Central Nervous System
 Lat. Ventricles:
Cervix Uterus Adnexa

 Cervical Length:    3.91     cm

 Cervix:       Normal appearance by transabdominal scan.
 Uterus:       No abnormality visualized.
 Cul De Sac:   No free fluid seen.

 Left Ovary:    Size(cm) L: 2.71 x W: 1.89 x H: 1.38  Volume(cc):
 Right Ovary:   Size(cm) L: 2.87 x W: 2.04 x H: 2.18  Volume(cc):
 Adnexa:     No adnexal mass visualized.
Impression

 IUP at 28+0 weeks
 Normal detailed fetal anatomy
 Normal amniotic fluid volume
 EDC based on today's measurements
Recommendations

 Follow-up ultrasound for growth in 4 weeks due to late US

## 2014-12-08 IMAGING — US US OB FOLLOW-UP
1 series · 12 of 28 positions shown · non-contrast
Comparison: none

[Series 1: us ob follow up · 12 of 37 slices shown]
[im 2/37]
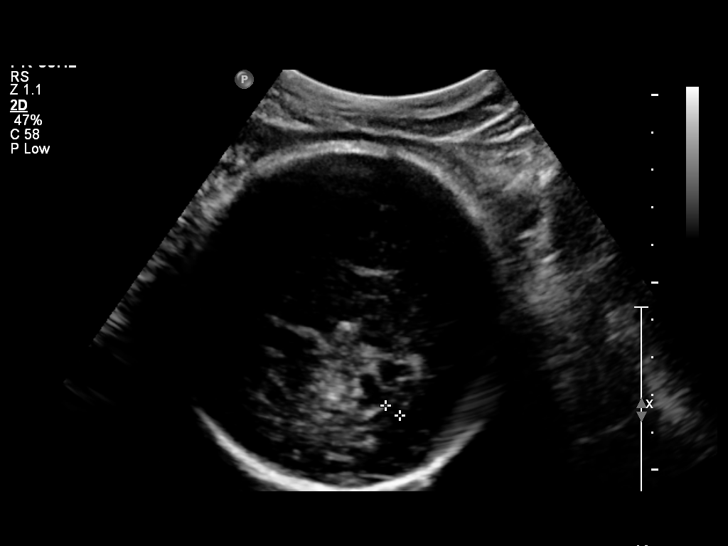
[im 5/37]
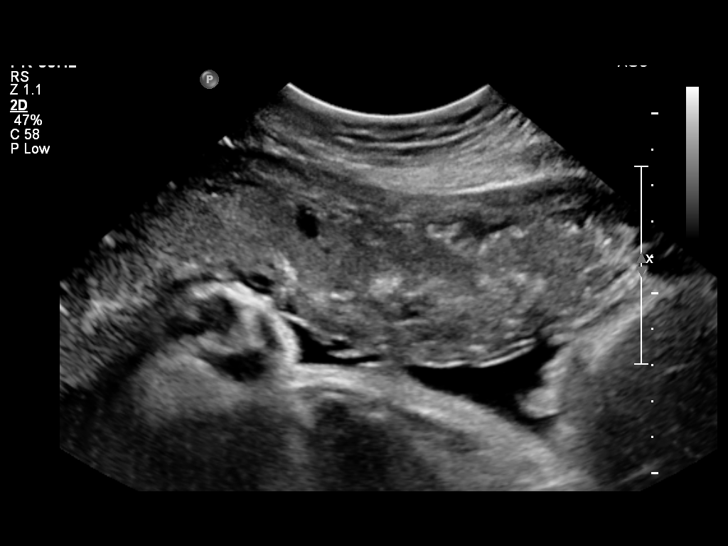
[im 7/37]
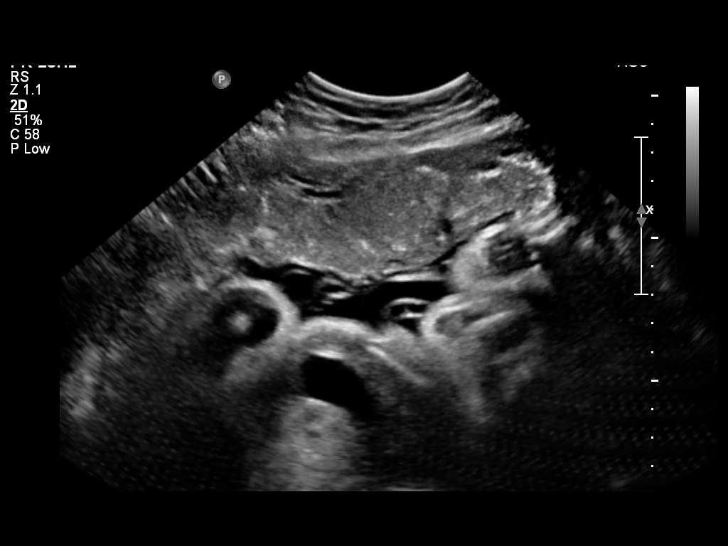
[im 11/37]
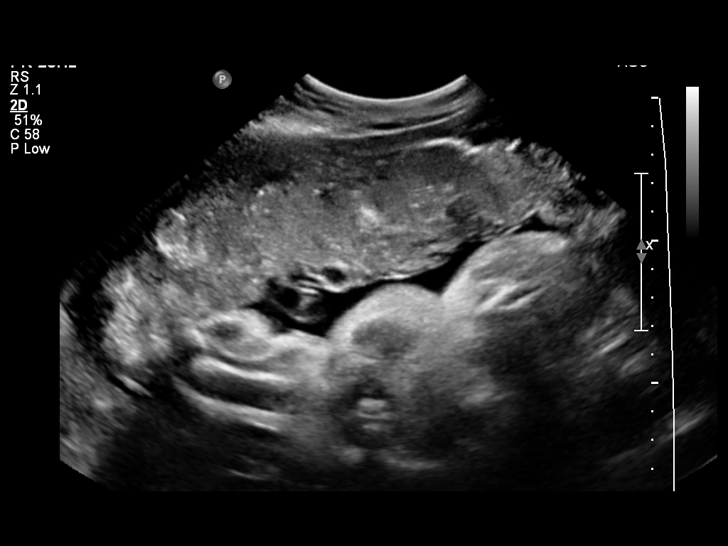
[im 14/37]
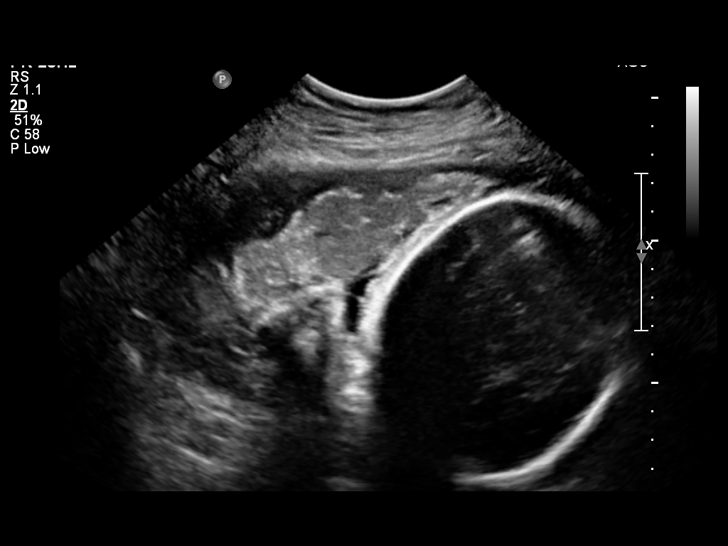
[im 17/37]
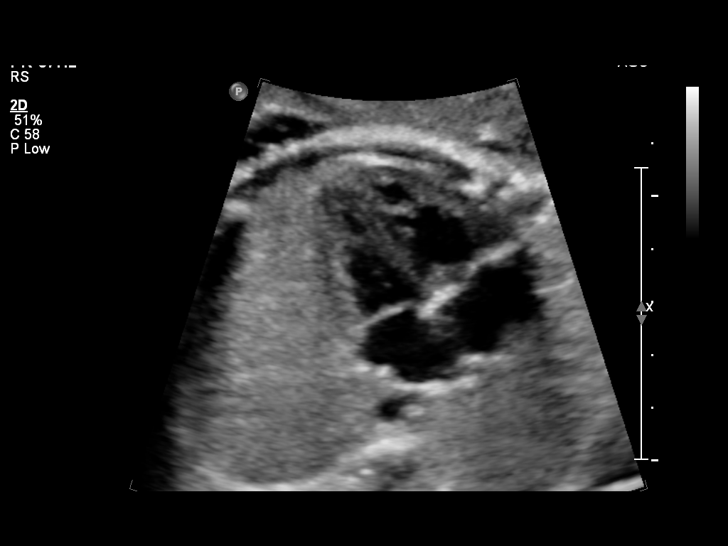
[im 21/37]
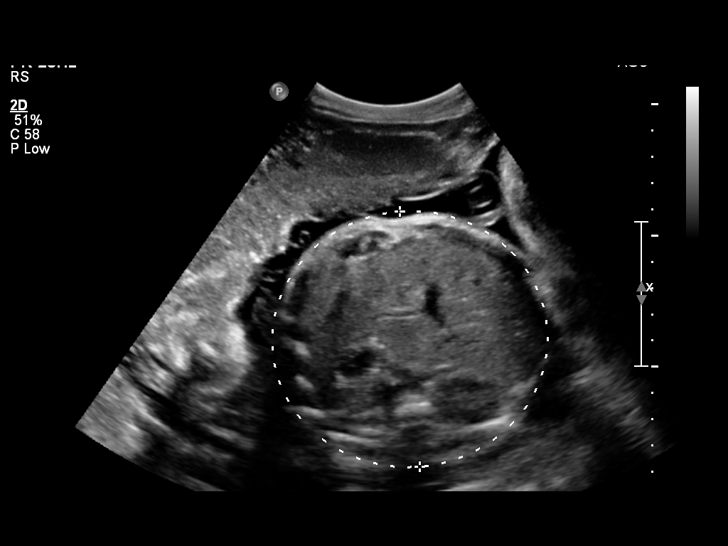
[im 23/37]
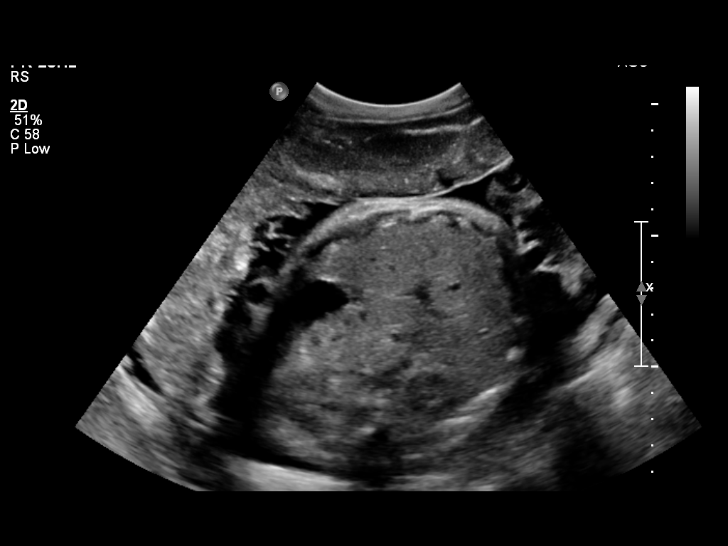
[im 26/37]
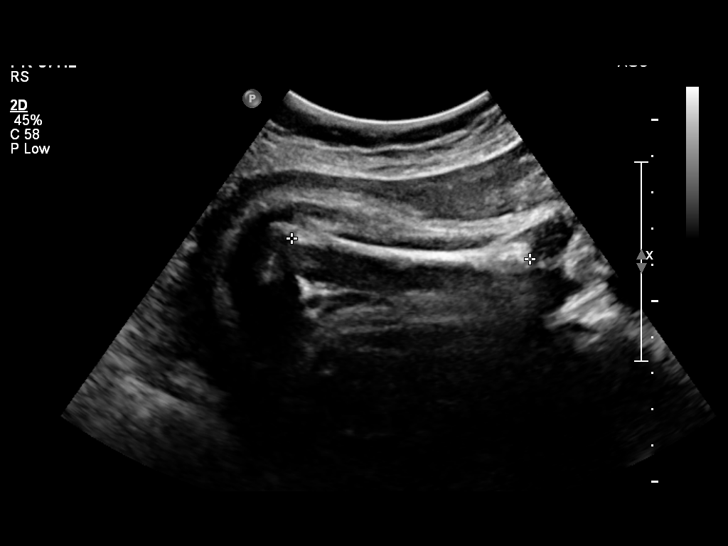
[im 30/37]
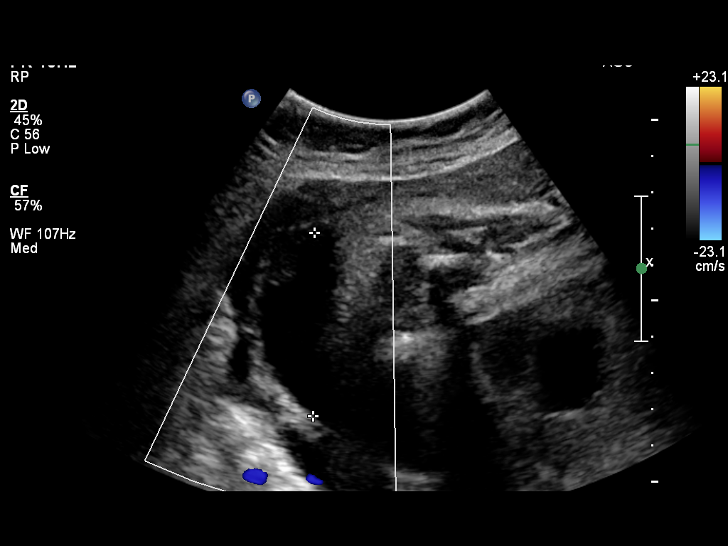
[im 33/37]
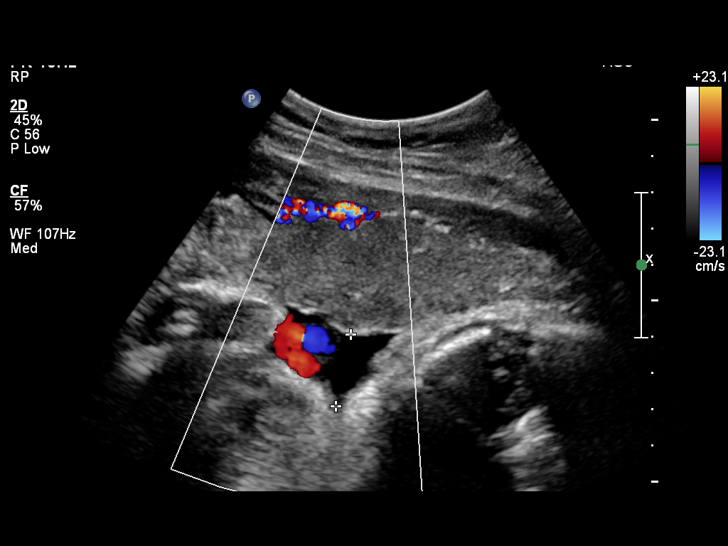
[im 35/37]
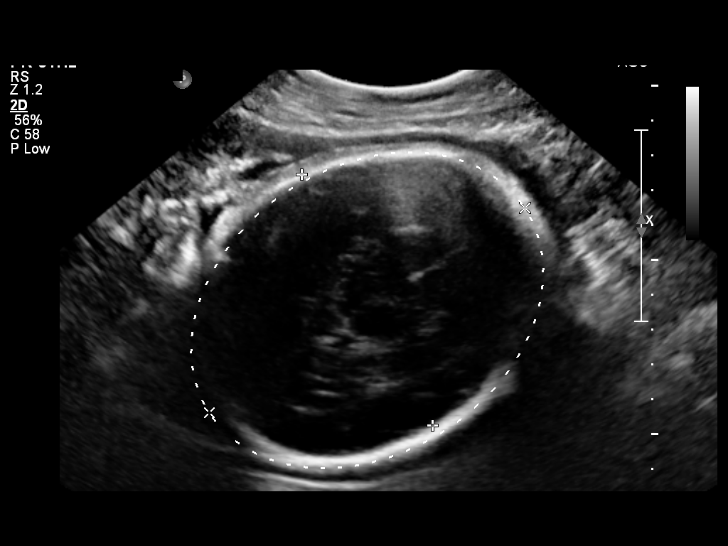

[12 of 28 positions shown; findings below may reference images not displayed]

OBSTETRICS REPORT
                      (Signed Final 05/21/2013 [DATE])

             BARUA

Service(s) Provided

 US OB FOLLOW UP                                       76816.1
Indications

 Size-Date Discrepancy
Fetal Evaluation

 Num Of Fetuses:    1
 Fetal Heart Rate:  134                          bpm
 Cardiac Activity:  Observed
 Presentation:      Cephalic
 Placenta:          Anterior, above cervical os
 P. Cord            Visualized
 Insertion:

 Amniotic Fluid
 AFI FV:      Subjectively within normal limits
 AFI Sum:     15.39   cm       56  %Tile     Larg Pckt:    5.21  cm
 RUQ:   5.04    cm   RLQ:    2.01   cm    LUQ:   5.21    cm   LLQ:    3.13   cm
Biometry

 BPD:     82.8  mm     G. Age:  33w 2d                CI:        75.84   70 - 86
                                                      FL/HC:      21.4   20.1 -

 HC:     301.4  mm     G. Age:  33w 3d      < 3  %    HC/AC:      0.95   0.93 -

 AC:       317  mm     G. Age:  35w 4d       74  %    FL/BPD:     77.8   71 - 87
 FL:      64.4  mm     G. Age:  33w 2d        8  %    FL/AC:      20.3   20 - 24

 Est. FW:    2663  gm      5 lb 6 oz     52  %
Gestational Age

 LMP:           42w 0d        Date:  07/31/12                 EDD:   05/07/13
 U/S Today:     33w 6d                                        EDD:   07/03/13
 Best:          35w 0d     Det. By:  U/S (04/02/13)           EDD:   06/25/13
Anatomy

 Cranium:          Appears normal         Aortic Arch:      Previously seen
 Fetal Cavum:      Appears normal         Ductal Arch:      Previously seen
 Ventricles:       Appears normal         Diaphragm:        Previously seen
 Choroid Plexus:   Previously seen        Stomach:          Appears normal, left
                                                            sided
 Cerebellum:       Previously seen        Abdomen:          Appears normal
 Posterior Fossa:  Previously seen        Abdominal Wall:   Previously seen
 Nuchal Fold:      Not applicable (>20    Cord Vessels:     Previously seen
                   wks GA)
 Face:             Orbits and profile     Kidneys:          Appear normal
                   previously seen
 Lips:             Previously seen        Bladder:          Appears normal
 Palate:           Not well visualized    Spine:            Previously seen
 Heart:            Appears normal         Lower             Previously seen
                   (4CH, axis, and        Extremities:
                   situs)
 RVOT:             Previously seen        Upper             Previously seen
                                          Extremities:
 LVOT:             Previously seen

 Other:  Female gender. Heels visualized. Technically difficult due to
         advanced GA and fetal position.
Cervix Uterus Adnexa

 Cervix:       Not visualized (advanced GA >34 wks)
 Uterus:       No abnormality visualized.
 Cul De Sac:   No free fluid seen.
 Left Ovary:    Not visualized.
 Right Ovary:   Not visualized.
 Adnexa:     No abnormality visualized.
Impression

 Single IUP at 33 [DATE] weeks
 Interval growth is appropriate (52nd %tile)
 Anterior placenta without previa
 Normal amniotic fluid volume
Recommendations

 Follow-up ultrasounds as clinically indicated.

## 2021-02-21 ENCOUNTER — Other Ambulatory Visit: Payer: Self-pay

## 2021-02-21 ENCOUNTER — Emergency Department (HOSPITAL_COMMUNITY): Payer: Self-pay

## 2021-02-21 ENCOUNTER — Encounter (HOSPITAL_COMMUNITY): Payer: Self-pay

## 2021-02-21 ENCOUNTER — Observation Stay (HOSPITAL_COMMUNITY)
Admission: EM | Admit: 2021-02-21 | Discharge: 2021-02-22 | Disposition: A | Discharge status: Home / Self Care | Payer: Self-pay | Attending: Surgery | Admitting: Surgery

## 2021-02-21 DIAGNOSIS — K37 Unspecified appendicitis: Secondary | ICD-10-CM | POA: Diagnosis present

## 2021-02-21 DIAGNOSIS — Z79899 Other long term (current) drug therapy: Secondary | ICD-10-CM | POA: Insufficient documentation

## 2021-02-21 DIAGNOSIS — Z7982 Long term (current) use of aspirin: Secondary | ICD-10-CM | POA: Insufficient documentation

## 2021-02-21 DIAGNOSIS — K3580 Unspecified acute appendicitis: Principal | ICD-10-CM | POA: Insufficient documentation

## 2021-02-21 DIAGNOSIS — Z20822 Contact with and (suspected) exposure to covid-19: Secondary | ICD-10-CM | POA: Insufficient documentation

## 2021-02-21 LAB — COMPREHENSIVE METABOLIC PANEL
ALT: 46 U/L — ABNORMAL HIGH (ref 0–44)
AST: 34 U/L (ref 15–41)
Albumin: 3.5 g/dL (ref 3.5–5.0)
Alkaline Phosphatase: 83 U/L (ref 38–126)
Anion gap: 11 (ref 5–15)
BUN: <5 mg/dL — ABNORMAL LOW (ref 6–20)
CO2: 22 mmol/L (ref 22–32)
Calcium: 8.5 mg/dL — ABNORMAL LOW (ref 8.9–10.3)
Chloride: 102 mmol/L (ref 98–111)
Creatinine, Ser: 0.48 mg/dL (ref 0.44–1.00)
GFR, Estimated: >60 mL/min (ref >60)
Glucose, Bld: 114 mg/dL — ABNORMAL HIGH (ref 70–99)
Potassium: 3.3 mmol/L — ABNORMAL LOW (ref 3.5–5.1)
Sodium: 135 mmol/L (ref 135–145)
Total Bilirubin: 0.7 mg/dL (ref 0.3–1.2)
Total Protein: 7.3 g/dL (ref 6.5–8.1)

## 2021-02-21 LAB — CBC WITH DIFFERENTIAL/PLATELET
Abs Immature Granulocytes: 0.06 10*3/uL (ref 0.00–0.07)
Basophils Absolute: 0 10*3/uL (ref 0.0–0.1)
Basophils Relative: 0 %
Eosinophils Absolute: 0.1 10*3/uL (ref 0.0–0.5)
Eosinophils Relative: 0 %
HCT: 40.8 % (ref 36.0–46.0)
Hemoglobin: 13.2 g/dL (ref 12.0–15.0)
Immature Granulocytes: 0 %
Lymphocytes Relative: 9 %
Lymphs Abs: 1.4 10*3/uL (ref 0.7–4.0)
MCH: 27.7 pg (ref 26.0–34.0)
MCHC: 32.4 g/dL (ref 30.0–36.0)
MCV: 85.5 fL (ref 80.0–100.0)
Monocytes Absolute: 0.8 10*3/uL (ref 0.1–1.0)
Monocytes Relative: 5 %
Neutro Abs: 13.2 10*3/uL — ABNORMAL HIGH (ref 1.7–7.7)
Neutrophils Relative %: 86 %
Platelets: 425 10*3/uL — ABNORMAL HIGH (ref 150–400)
RBC: 4.77 MIL/uL (ref 3.87–5.11)
RDW: 13.2 % (ref 11.5–15.5)
WBC: 15.5 10*3/uL — ABNORMAL HIGH (ref 4.0–10.5)
nRBC: 0 % (ref 0.0–0.2)

## 2021-02-21 LAB — URINALYSIS, MICROSCOPIC (REFLEX): Bacteria, UA: NONE SEEN

## 2021-02-21 LAB — URINALYSIS, ROUTINE W REFLEX MICROSCOPIC
Bilirubin Urine: NEGATIVE
Glucose, UA: NEGATIVE mg/dL
Ketones, ur: >80 mg/dL — AB
Leukocytes,Ua: NEGATIVE
Nitrite: NEGATIVE
Protein, ur: >300 mg/dL — AB
Specific Gravity, Urine: 1.025 (ref 1.005–1.030)
pH: 7 (ref 5.0–8.0)

## 2021-02-21 LAB — LIPASE, BLOOD: Lipase: 19 U/L (ref 11–51)

## 2021-02-21 LAB — RESP PANEL BY RT-PCR (FLU A&B, COVID) ARPGX2
Influenza A by PCR: NEGATIVE
Influenza B by PCR: NEGATIVE
SARS Coronavirus 2 by RT PCR: NEGATIVE

## 2021-02-21 LAB — PREGNANCY, URINE: Preg Test, Ur: NEGATIVE

## 2021-02-21 LAB — I-STAT BETA HCG BLOOD, ED (MC, WL, AP ONLY): I-stat hCG, quantitative: <5 m[IU]/mL (ref <5)

## 2021-02-21 MED ORDER — PROCHLORPERAZINE EDISYLATE 10 MG/2ML IJ SOLN: 10.0000 mg | INTRAMUSCULAR | Status: DC | PRN

## 2021-02-21 MED ORDER — ENOXAPARIN SODIUM 40 MG/0.4ML IJ SOSY
40.0000 mg | PREFILLED_SYRINGE | INTRAMUSCULAR | Status: DC
  Administered 2021-02-21: 40 mg via SUBCUTANEOUS
  Filled 2021-02-21: qty 0.4

## 2021-02-21 MED ORDER — PIPERACILLIN-TAZOBACTAM 3.375 G IVPB
3.3750 g | Freq: Three times a day (TID) | INTRAVENOUS | Status: DC
Start: 2021-02-21 — End: 2021-02-22
  Administered 2021-02-21 – 2021-02-22 (×2): 3.375 g via INTRAVENOUS
  Filled 2021-02-21 (×2): qty 50

## 2021-02-21 MED ORDER — METHOCARBAMOL 1000 MG/10ML IJ SOLN
500.0000 mg | Freq: Four times a day (QID) | INTRAVENOUS | Status: DC | PRN
  Filled 2021-02-21: qty 5

## 2021-02-21 MED ORDER — ACETAMINOPHEN 500 MG PO TABS
1000.0000 mg | ORAL_TABLET | Freq: Once | ORAL | Status: AC
  Administered 2021-02-21: 1000 mg via ORAL
  Filled 2021-02-21: qty 2

## 2021-02-21 MED ORDER — LACTATED RINGERS IV BOLUS
1000.0000 mL | Freq: Once | INTRAVENOUS | Status: AC
  Administered 2021-02-21: 1000 mL via INTRAVENOUS

## 2021-02-21 MED ORDER — MORPHINE SULFATE (PF) 4 MG/ML IV SOLN
4.0000 mg | Freq: Once | INTRAVENOUS | Status: AC
  Administered 2021-02-21: 4 mg via INTRAVENOUS
  Filled 2021-02-21: qty 1

## 2021-02-21 MED ORDER — ONDANSETRON HCL 4 MG/2ML IJ SOLN: 4.0000 mg | Freq: Four times a day (QID) | INTRAMUSCULAR | Status: DC | PRN

## 2021-02-21 MED ORDER — DOCUSATE SODIUM 100 MG PO CAPS
100.0000 mg | ORAL_CAPSULE | Freq: Two times a day (BID) | ORAL | Status: DC
  Administered 2021-02-21: 100 mg via ORAL
  Filled 2021-02-21: qty 1

## 2021-02-21 MED ORDER — OXYCODONE HCL 5 MG PO TABS: 5.0000 mg | ORAL_TABLET | ORAL | Status: DC | PRN

## 2021-02-21 MED ORDER — LACTATED RINGERS IV SOLN: INTRAVENOUS | Status: DC

## 2021-02-21 MED ORDER — ACETAMINOPHEN 325 MG PO TABS: 650.0000 mg | ORAL_TABLET | Freq: Four times a day (QID) | ORAL | Status: DC

## 2021-02-21 MED ORDER — HYDROMORPHONE HCL 1 MG/ML IJ SOLN: 0.5000 mg | INTRAMUSCULAR | Status: DC | PRN

## 2021-02-21 MED ORDER — IOHEXOL 300 MG/ML  SOLN
100.0000 mL | Freq: Once | INTRAMUSCULAR | Status: AC | PRN
  Administered 2021-02-21: 100 mL via INTRAVENOUS

## 2021-02-21 MED ORDER — SIMETHICONE 80 MG PO CHEW: 80.0000 mg | CHEWABLE_TABLET | Freq: Four times a day (QID) | ORAL | Status: DC | PRN

## 2021-02-21 NOTE — ED Provider Notes (Signed)
Emergency Medicine Provider Triage Evaluation Note  Rebecca English , a 35 y.o. female  was evaluated in triage.  Pt complains of diffuse abdominal pain that began a week ago.  Reports associated nausea, vomiting, frequent stools.  Also complains of urinary urgency and chills.  Denies fever and dysuria.  Review of Systems  Positive:  Negative: See above  Physical Exam  BP 114/83 (BP Location: Left Arm)   Pulse 71   Temp 97.8 F (36.6 C) (Oral)   Resp (!) 22   SpO2 100%  Gen:   Awake, no distress   Resp:  Normal effort  MSK:   Moves extremities without difficulty  Other:  Moderate diffuse abdominal tenderness  Medical Decision Making  Medically screening exam initiated at 2:58 PM.  Appropriate orders placed.  Rebecca English was informed that the remainder of the evaluation will be completed by another provider, this initial triage assessment does not replace that evaluation, and the importance of remaining in the ED until their evaluation is complete.     Rebecca Lower, PA-C 02/21/21 1500    Rebecca Forts, DO 02/23/21 1952

## 2021-02-21 NOTE — ED Triage Notes (Signed)
Patient complains of lower abdominal pain with nausea and vomiting and frequent stools. Denies dysuria, denies fever.  LMP last week

## 2021-02-21 NOTE — ED Provider Notes (Signed)
[facility] EMERGENCY DEPARTMENT Provider Note   CSN: [id number] Arrival date & time: 02/21/21  1424     History No chief complaint on file.   Rebecca English is a 35 y.o. female who presents for evaluation of abdominal pain.   She reports experiencing severe abdominal pain beginning this morning. She states that she was just waking up when it began. She reports nausea and vomiting. She denies diarrhea and constipation. She has had decreased appetite over this time.  She denies fever or chills.Normal bladder and bowel habits. She reports no dysuria or increased urinary frequency. No vaginal bleeding or discharge. She reports experiencing similar symptoms in the past. She has been in her normal state of health over the preceding days.      Past Medical History:  Diagnosis Date   Medical history non-contributory     Patient Active Problem List   Diagnosis Date Noted   Appendicitis 02/21/2021   Spontaneous vaginal delivery 06/24/2013   NSVD (normal spontaneous vaginal delivery) 06/24/2013    Past Surgical History:  Procedure Laterality Date   NO PAST SURGERIES       OB History     Gravida  5   Para  4   Term  4   Preterm  0   AB  1   Living  5      SAB  1   IAB  0   Ectopic  0   Multiple  0   Live Births  1           Family History  Problem Relation Age of Onset   Diabetes Paternal Grandmother    Hearing loss Neg Hx     Social History   Tobacco Use   Smoking status: Never   Smokeless tobacco: Never  Substance Use Topics   Alcohol use: No   Drug use: No    Home Medications Prior to Admission medications   Medication Sig Start Date End Date Taking? Authorizing Provider  aspirin EC 81 MG tablet Take 81 mg by mouth daily as needed for moderate pain or fever. Swallow whole.   Yes [provider]  ibuprofen (ADVIL) 200 MG tablet Take 200 mg by mouth every 6 (six) hours as needed for headache or moderate pain.    Yes [provider]  ibuprofen (ADVIL,MOTRIN) 600 MG tablet Take 1 tablet (600 mg total) by mouth every 6 (six) hours. Patient not taking: Reported on 02/21/2021 06/25/13   Minta Balsam, MD    Allergies    Patient has no known allergies.  Review of Systems   Review of Systems  Constitutional:  Negative for chills and fever.  HENT:  Negative for ear pain and sore throat.   Eyes:  Negative for pain and visual disturbance.  Respiratory:  Negative for cough and shortness of breath.   Cardiovascular:  Negative for chest pain and palpitations.  Gastrointestinal:  Positive for abdominal pain, nausea and vomiting.  Genitourinary:  Negative for dysuria and hematuria.  Musculoskeletal:  Negative for arthralgias and back pain.  Skin:  Negative for color change and rash.  Neurological:  Negative for seizures and syncope.  All other systems reviewed and are negative.  Physical Exam Updated Vital Signs BP 133/87   Pulse 80   Temp 97.9 F (36.6 C)   Resp 18   SpO2 99%   Physical Exam Vitals and nursing note reviewed.  Constitutional:      General: She is not  in acute distress.    Appearance: She is well-developed.  HENT:     Head: Normocephalic and atraumatic.  Eyes:     Conjunctiva/sclera: Conjunctivae normal.  Cardiovascular:     Rate and Rhythm: Normal rate and regular rhythm.     Heart sounds: No murmur heard. Pulmonary:     Effort: Pulmonary effort is normal. No respiratory distress.     Breath sounds: Normal breath sounds.  Abdominal:     Palpations: Abdomen is soft.     Tenderness: There is abdominal tenderness in the right lower quadrant, suprapubic area and left lower quadrant.  Musculoskeletal:     Cervical back: Neck supple.  Skin:    General: Skin is warm and dry.  Neurological:     Mental Status: She is alert.    ED Results / Procedures / Treatments   Labs (all labs ordered are listed, but only abnormal results are displayed) Labs Reviewed   COMPREHENSIVE METABOLIC PANEL - Abnormal; Notable for the following components:      Result Value   Potassium 3.3 (*)    Glucose, Bld 114 (*)    BUN <5 (*)    Calcium 8.5 (*)    ALT 46 (*)    All other components within normal limits  CBC WITH DIFFERENTIAL/PLATELET - Abnormal; Notable for the following components:   WBC 15.5 (*)    Platelets 425 (*)    Neutro Abs 13.2 (*)    All other components within normal limits  URINALYSIS, ROUTINE W REFLEX MICROSCOPIC - Abnormal; Notable for the following components:   Hgb urine dipstick TRACE (*)    Ketones, ur >80 (*)    Protein, ur >300 (*)    All other components within normal limits  RESP PANEL BY RT-PCR (FLU A&B, COVID) ARPGX2  LIPASE, BLOOD  PREGNANCY, URINE  URINALYSIS, MICROSCOPIC (REFLEX)  HIV ANTIBODY (ROUTINE TESTING W REFLEX)  BASIC METABOLIC PANEL  CBC  I-STAT BETA HCG BLOOD, ED ([facility], [facility], [facility] ONLY)   EKG None  Radiology CT ABDOMEN PELVIS W CONTRAST  Result Date: 02/21/2021 CLINICAL DATA:  Acute abdominal pain. EXAM: CT ABDOMEN AND PELVIS WITH CONTRAST TECHNIQUE: Multidetector CT imaging of the abdomen and pelvis was performed using the standard protocol following bolus administration of intravenous contrast. CONTRAST:  OMNIPAQUE IOHEXOL 300 MG/ML  SOLN COMPARISON:  None. FINDINGS: Lower chest: No acute abnormality. Hepatobiliary: No focal liver abnormality is seen. No gallstones, gallbladder wall thickening, or biliary dilatation. Pancreas: Unremarkable. No pancreatic ductal dilatation or surrounding inflammatory changes. Spleen: Normal in size without focal abnormality. Adrenals/Urinary Tract: Adrenal glands are unremarkable. Kidneys are normal, without renal calculi, focal lesion, or hydronephrosis. Bladder is unremarkable. Stomach/Bowel: Stomach is within normal limits. There is no bowel obstruction. The appendix is dilated measuring 1 cm in diameter. Appendicoliths are present. There is mild surrounding inflammatory  stranding and a small amount of free fluid adjacent to the tip of the appendix. There is no evidence for abscess. Vascular/Lymphatic: No significant vascular findings are present. No enlarged abdominal or pelvic lymph nodes. Reproductive: Uterus and bilateral adnexa are unremarkable. Other: There is a tiny fat containing umbilical hernia. There is no significant ascites. Musculoskeletal: No acute or significant osseous findings. IMPRESSION: 1. Acute uncomplicated appendicitis. Electronically Signed   By: Darliss Cheney M.D.   On: 02/21/2021 19:57    Procedures Procedures   Medications Ordered in ED Medications  enoxaparin (LOVENOX) injection 40 mg (has no administration in time range)  lactated ringers infusion (has  no administration in time range)  piperacillin-tazobactam (ZOSYN) IVPB 3.375 g (has no administration in time range)  acetaminophen (TYLENOL) tablet 650 mg (650 mg Oral Not Given 02/21/21 2108)  oxyCODONE (Oxy IR/ROXICODONE) immediate release tablet 5 mg (has no administration in time range)  HYDROmorphone (DILAUDID) injection 0.5 mg (has no administration in time range)  methocarbamol (ROBAXIN) 500 mg in dextrose 5 % 50 mL IVPB (has no administration in time range)  ondansetron (ZOFRAN) injection 4 mg (has no administration in time range)  prochlorperazine (COMPAZINE) injection 10 mg (has no administration in time range)  simethicone (MYLICON) chewable tablet 80 mg (has no administration in time range)  docusate sodium (COLACE) capsule 100 mg (has no administration in time range)  lactated ringers bolus 1,000 mL (1,000 mLs Intravenous New Bag/Given 02/21/21 1908)  acetaminophen (TYLENOL) tablet 1,000 mg (1,000 mg Oral Given 02/21/21 1908)  morphine 4 MG/ML injection 4 mg (4 mg Intravenous Given 02/21/21 2008)  iohexol (OMNIPAQUE) 300 MG/ML solution 100 mL (100 mLs Intravenous Contrast Given 02/21/21 1939)    ED Course  I have reviewed the triage vital signs and the nursing  notes.  Pertinent labs & imaging results that were available during my care of the patient were reviewed by me and considered in my medical decision making (see chart for details).  Clinical Course as of 02/21/21 2134  Wynelle Link Feb 21, 2021  [id number] 35 year old female primarily Spanish-speaking here with lower abdominal pain frequent stools.  Diffusely tender specifically in the right lower quadrant.  Has an elevated white count.  Getting CT abdomen and pelvis.  Disposition per results of testing. [MB]    Clinical Course User Index [MB] Terrilee Files, MD   MDM Rules/Calculators/A&P                           35 y.o. female with past medical history as above who presents for evaluation of abdominal pain. Afebrile and hemodynamically stable.  Exam as detailed above.  Labs notable for leukocytosis with left shift.  Mild hypokalemia with potassium 3.3.  Lipase normal.  Urinalysis with ketonuria and proteinuria.  Pregnancy testing is negative.  CT abdomen pelvis demonstrates findings concerning for acute uncomplicated appendicitis.  General surgery was consulted for further recommendations, who will plan for laparoscopic appendectomy.  They will admit for further management.  Final Clinical Impression(s) / ED Diagnoses Final diagnoses:  Acute appendicitis, unspecified acute appendicitis type    Rx / DC Orders ED Discharge Orders     None        Holley Dexter, MD 02/21/21 2134    Terrilee Files, MD 02/22/21 1025

## 2021-02-21 NOTE — H&P (Signed)
Admitting Physician: Hyman Hopes Ernst Cumpston  Service: General Surgery  CC: Abdominal pain  Subjective   HPI: Rebecca English is an 35 y.o. female who is here for abdominal pain.  She had pain all over her abdomen.  It started this morning around 9 AM.  It was so severe it brought her into the hospital.  She hasn't had much of an appetite.  She has had some nausea.  Past Medical History:  Diagnosis Date   Medical history non-contributory     Past Surgical History:  Procedure Laterality Date   NO PAST SURGERIES      Family History  Problem Relation Age of Onset   Diabetes Paternal Grandmother    Hearing loss Neg Hx     Social:  reports that she has never smoked. She has never used smokeless tobacco. She reports that she does not drink alcohol and does not use drugs.  Allergies: No Known Allergies  Medications: Current Outpatient Medications  Medication Instructions   aspirin EC 81 mg, Oral, Daily PRN, Swallow whole.   ibuprofen (ADVIL) 600 mg, Oral, Every 6 hours   ibuprofen (ADVIL) 200 mg, Oral, Every 6 hours PRN    ROS - all of the below systems have been reviewed with the patient and positives are indicated with bold text General: chills, fever or night sweats Eyes: blurry vision or double vision ENT: epistaxis or sore throat Allergy/Immunology: itchy/watery eyes or nasal congestion Hematologic/Lymphatic: bleeding problems, blood clots or swollen lymph nodes Endocrine: temperature intolerance or unexpected weight changes Breast: new or changing breast lumps or nipple discharge Resp: cough, shortness of breath, or wheezing CV: chest pain or dyspnea on exertion GI: as per HPI GU: dysuria, trouble voiding, or hematuria MSK: joint pain or joint stiffness Neuro: TIA or stroke symptoms Derm: pruritus and skin lesion changes Psych: anxiety and depression  Objective   PE Blood pressure 105/77, pulse 85, temperature 97.9 F (36.6 C), resp. rate 15, SpO2 97  %, unknown if currently breastfeeding. Constitutional: NAD; conversant; no deformities Eyes: Moist conjunctiva; no lid lag; anicteric; PERRL Neck: Trachea midline; no thyromegaly Lungs: Normal respiratory effort; no tactile fremitus CV: RRR; no palpable thrills; no pitting edema GI: Abd Tender RLQ; no palpable hepatosplenomegaly MSK: Normal range of motion of extremities; no clubbing/cyanosis Psychiatric: Appropriate affect; alert and oriented x3 Lymphatic: No palpable cervical or axillary lymphadenopathy  Results for orders placed or performed during the hospital encounter of 02/21/21 (from the past 24 hour(s))  Comprehensive metabolic panel     Status: Abnormal   Collection Time: 02/21/21  2:57 PM  Result Value Ref Range   Sodium 135 135 - 145 mmol/L   Potassium 3.3 (L) 3.5 - 5.1 mmol/L   Chloride 102 98 - 111 mmol/L   CO2 22 22 - 32 mmol/L   Glucose, Bld 114 (H) 70 - 99 mg/dL   BUN <5 (L) 6 - 20 mg/dL   Creatinine, Ser 4.25 0.44 - 1.00 mg/dL   Calcium 8.5 (L) 8.9 - 10.3 mg/dL   Total Protein 7.3 6.5 - 8.1 g/dL   Albumin 3.5 3.5 - 5.0 g/dL   AST 34 15 - 41 U/L   ALT 46 (H) 0 - 44 U/L   Alkaline Phosphatase 83 38 - 126 U/L   Total Bilirubin 0.7 0.3 - 1.2 mg/dL   GFR, Estimated >95 >63 mL/min   Anion gap 11 5 - 15  CBC with Differential     Status: Abnormal   Collection Time:  02/21/21  2:57 PM  Result Value Ref Range   WBC 15.5 (H) 4.0 - 10.5 K/uL   RBC 4.77 3.87 - 5.11 MIL/uL   Hemoglobin 13.2 12.0 - 15.0 g/dL   HCT 93.7 90.2 - 40.9 %   MCV 85.5 80.0 - 100.0 fL   MCH 27.7 26.0 - 34.0 pg   MCHC 32.4 30.0 - 36.0 g/dL   RDW 73.5 32.9 - 92.4 %   Platelets 425 (H) 150 - 400 K/uL   nRBC 0.0 0.0 - 0.2 %   Neutrophils Relative % 86 %   Neutro Abs 13.2 (H) 1.7 - 7.7 K/uL   Lymphocytes Relative 9 %   Lymphs Abs 1.4 0.7 - 4.0 K/uL   Monocytes Relative 5 %   Monocytes Absolute 0.8 0.1 - 1.0 K/uL   Eosinophils Relative 0 %   Eosinophils Absolute 0.1 0.0 - 0.5 K/uL   Basophils  Relative 0 %   Basophils Absolute 0.0 0.0 - 0.1 K/uL   Immature Granulocytes 0 %   Abs Immature Granulocytes 0.06 0.00 - 0.07 K/uL  Lipase, blood     Status: None   Collection Time: 02/21/21  2:57 PM  Result Value Ref Range   Lipase 19 11 - 51 U/L  Urinalysis, Routine w reflex microscopic Urine, Clean Catch     Status: Abnormal   Collection Time: 02/21/21  6:38 PM  Result Value Ref Range   Color, Urine YELLOW YELLOW   APPearance CLEAR CLEAR   Specific Gravity, Urine 1.025 1.005 - 1.030   pH 7.0 5.0 - 8.0   Glucose, UA NEGATIVE NEGATIVE mg/dL   Hgb urine dipstick TRACE (A) NEGATIVE   Bilirubin Urine NEGATIVE NEGATIVE   Ketones, ur >80 (A) NEGATIVE mg/dL   Protein, ur >268 (A) NEGATIVE mg/dL   Nitrite NEGATIVE NEGATIVE   Leukocytes,Ua NEGATIVE NEGATIVE  I-Stat beta hCG blood, ED     Status: None   Collection Time: 02/21/21  6:38 PM  Result Value Ref Range   I-stat hCG, quantitative <5.0 <5 mIU/mL   Comment 3          Urinalysis, Microscopic (reflex)     Status: None   Collection Time: 02/21/21  6:38 PM  Result Value Ref Range   RBC / HPF 6-10 0 - 5 RBC/hpf   WBC, UA 0-5 0 - 5 WBC/hpf   Bacteria, UA NONE SEEN NONE SEEN   Squamous Epithelial / LPF 0-5 0 - 5   Mucus PRESENT   Pregnancy, urine     Status: None   Collection Time: 02/21/21  6:39 PM  Result Value Ref Range   Preg Test, Ur NEGATIVE NEGATIVE  Resp Panel by RT-PCR (Flu A&B, Covid) Nasopharyngeal Swab     Status: None   Collection Time: 02/21/21  8:08 PM   Specimen: Nasopharyngeal Swab; Nasopharyngeal(NP) swabs in vial transport medium  Result Value Ref Range   SARS Coronavirus 2 by RT PCR NEGATIVE NEGATIVE   Influenza A by PCR NEGATIVE NEGATIVE   Influenza B by PCR NEGATIVE NEGATIVE    Imaging Orders         CT ABDOMEN PELVIS W CONTRAST    1. Acute uncomplicated appendicitis.     Assessment and Plan   Rebecca English is an 35 y.o. female with abdominal pain found to have appendicitis.  I  recommended laparoscopic appendectomy.  The procedure itself as well as its risks, benefits and alternatives were discussed with the assistance of the spanish interpreter.  After  a full discussion and all questions answered the patient granted consent to proceed.  We will add her on to the OR schedule for the morning for my partner Dr. Magnus Ivan.   Quentin Ore, MD  [facility] Use [email or web address] to contact on call provider

## 2021-02-21 NOTE — ED Notes (Signed)
Patient transported to CT 

## 2021-02-22 ENCOUNTER — Inpatient Hospital Stay (HOSPITAL_COMMUNITY): Payer: Self-pay | Admitting: Certified Registered Nurse Anesthetist

## 2021-02-22 ENCOUNTER — Encounter (HOSPITAL_COMMUNITY)
Admission: EM | Disposition: A | Discharge status: Home / Self Care | Payer: Self-pay | Source: Home / Self Care | Attending: Emergency Medicine

## 2021-02-22 ENCOUNTER — Encounter (HOSPITAL_COMMUNITY): Payer: Self-pay

## 2021-02-22 HISTORY — PX: LAPAROSCOPIC APPENDECTOMY: SHX408

## 2021-02-22 LAB — BASIC METABOLIC PANEL
Anion gap: 8 (ref 5–15)
BUN: 5 mg/dL — ABNORMAL LOW (ref 6–20)
CO2: 26 mmol/L (ref 22–32)
Calcium: 8 mg/dL — ABNORMAL LOW (ref 8.9–10.3)
Chloride: 103 mmol/L (ref 98–111)
Creatinine, Ser: 0.45 mg/dL (ref 0.44–1.00)
GFR, Estimated: >60 mL/min (ref >60)
Glucose, Bld: 99 mg/dL (ref 70–99)
Potassium: 3.4 mmol/L — ABNORMAL LOW (ref 3.5–5.1)
Sodium: 137 mmol/L (ref 135–145)

## 2021-02-22 LAB — CBC
HCT: 35.8 % — ABNORMAL LOW (ref 36.0–46.0)
Hemoglobin: 11.9 g/dL — ABNORMAL LOW (ref 12.0–15.0)
MCH: 28.1 pg (ref 26.0–34.0)
MCHC: 33.2 g/dL (ref 30.0–36.0)
MCV: 84.4 fL (ref 80.0–100.0)
Platelets: 357 10*3/uL (ref 150–400)
RBC: 4.24 MIL/uL (ref 3.87–5.11)
RDW: 13.4 % (ref 11.5–15.5)
WBC: 12.6 10*3/uL — ABNORMAL HIGH (ref 4.0–10.5)
nRBC: 0 % (ref 0.0–0.2)

## 2021-02-22 LAB — HIV ANTIBODY (ROUTINE TESTING W REFLEX): HIV Screen 4th Generation wRfx: NONREACTIVE

## 2021-02-22 SURGERY — APPENDECTOMY, LAPAROSCOPIC
Anesthesia: General | Site: Abdomen

## 2021-02-22 MED ORDER — PROPOFOL 10 MG/ML IV BOLUS
INTRAVENOUS | Status: AC
  Filled 2021-02-22: qty 20

## 2021-02-22 MED ORDER — ONDANSETRON HCL 4 MG/2ML IJ SOLN
INTRAMUSCULAR | Status: AC
  Filled 2021-02-22: qty 2

## 2021-02-22 MED ORDER — FENTANYL CITRATE (PF) 100 MCG/2ML IJ SOLN
25.0000 ug | INTRAMUSCULAR | Status: DC | PRN
  Administered 2021-02-22 (×3): 25 ug via INTRAVENOUS

## 2021-02-22 MED ORDER — SUGAMMADEX SODIUM 200 MG/2ML IV SOLN
INTRAVENOUS | Status: DC | PRN
  Administered 2021-02-22: 200 mg via INTRAVENOUS

## 2021-02-22 MED ORDER — OXYCODONE HCL 5 MG PO TABS: 5.0000 mg | ORAL_TABLET | Freq: Once | ORAL | Status: DC | PRN

## 2021-02-22 MED ORDER — FENTANYL CITRATE (PF) 250 MCG/5ML IJ SOLN
INTRAMUSCULAR | Status: DC | PRN
  Administered 2021-02-22: 100 ug via INTRAVENOUS
  Administered 2021-02-22 (×2): 50 ug via INTRAVENOUS

## 2021-02-22 MED ORDER — POLYETHYLENE GLYCOL 3350 17 G PO PACK: 17.0000 g | PACK | Freq: Every day | ORAL | 0 refills | Status: AC | PRN

## 2021-02-22 MED ORDER — BUPIVACAINE-EPINEPHRINE 0.5% -1:200000 IJ SOLN
INTRAMUSCULAR | Status: AC
  Filled 2021-02-22: qty 1

## 2021-02-22 MED ORDER — SODIUM CHLORIDE 0.9 % IV SOLN: INTRAVENOUS | Status: DC

## 2021-02-22 MED ORDER — OXYCODONE HCL 5 MG/5ML PO SOLN
5.0000 mg | Freq: Once | ORAL | Status: DC | PRN
Start: 2021-02-22 — End: 2021-02-22

## 2021-02-22 MED ORDER — 0.9 % SODIUM CHLORIDE (POUR BTL) OPTIME
TOPICAL | Status: DC | PRN
  Administered 2021-02-22: 1000 mL

## 2021-02-22 MED ORDER — BUPIVACAINE-EPINEPHRINE 0.5% -1:200000 IJ SOLN
INTRAMUSCULAR | Status: DC | PRN
  Administered 2021-02-22: 20 mL

## 2021-02-22 MED ORDER — PROPOFOL 10 MG/ML IV BOLUS
INTRAVENOUS | Status: DC | PRN
  Administered 2021-02-22: 120 mg via INTRAVENOUS

## 2021-02-22 MED ORDER — FENTANYL CITRATE (PF) 100 MCG/2ML IJ SOLN
INTRAMUSCULAR | Status: AC
  Administered 2021-02-22: 25 ug via INTRAVENOUS
  Filled 2021-02-22: qty 2

## 2021-02-22 MED ORDER — MIDAZOLAM HCL 2 MG/2ML IJ SOLN
INTRAMUSCULAR | Status: AC
  Filled 2021-02-22: qty 2

## 2021-02-22 MED ORDER — OXYCODONE HCL 5 MG PO TABS: 5.0000 mg | ORAL_TABLET | Freq: Four times a day (QID) | ORAL | 0 refills | Status: AC | PRN

## 2021-02-22 MED ORDER — MEPERIDINE HCL 25 MG/ML IJ SOLN: 6.2500 mg | INTRAMUSCULAR | Status: DC | PRN

## 2021-02-22 MED ORDER — ACETAMINOPHEN 325 MG PO TABS: 650.0000 mg | ORAL_TABLET | Freq: Four times a day (QID) | ORAL | Status: AC | PRN

## 2021-02-22 MED ORDER — LIDOCAINE 2% (20 MG/ML) 5 ML SYRINGE
INTRAMUSCULAR | Status: AC
  Filled 2021-02-22: qty 5

## 2021-02-22 MED ORDER — FENTANYL CITRATE (PF) 250 MCG/5ML IJ SOLN
INTRAMUSCULAR | Status: AC
  Filled 2021-02-22: qty 5

## 2021-02-22 MED ORDER — PHENYLEPHRINE 40 MCG/ML (10ML) SYRINGE FOR IV PUSH (FOR BLOOD PRESSURE SUPPORT)
PREFILLED_SYRINGE | INTRAVENOUS | Status: AC
  Filled 2021-02-22: qty 10

## 2021-02-22 MED ORDER — ONDANSETRON HCL 4 MG/2ML IJ SOLN: 4.0000 mg | Freq: Once | INTRAMUSCULAR | Status: DC | PRN

## 2021-02-22 MED ORDER — LACTATED RINGERS IV SOLN: INTRAVENOUS | Status: DC | PRN

## 2021-02-22 MED ORDER — MORPHINE SULFATE (PF) 2 MG/ML IV SOLN
1.0000 mg | INTRAVENOUS | Status: DC | PRN
  Filled 2021-02-22: qty 2

## 2021-02-22 MED ORDER — SODIUM CHLORIDE 0.9 % IV SOLN: INTRAVENOUS | Status: DC | PRN

## 2021-02-22 MED ORDER — ONDANSETRON HCL 4 MG/2ML IJ SOLN
INTRAMUSCULAR | Status: DC | PRN
  Administered 2021-02-22: 4 mg via INTRAVENOUS

## 2021-02-22 MED ORDER — ROCURONIUM BROMIDE 10 MG/ML (PF) SYRINGE
PREFILLED_SYRINGE | INTRAVENOUS | Status: DC | PRN
  Administered 2021-02-22: 30 mg via INTRAVENOUS

## 2021-02-22 MED ORDER — PHENYLEPHRINE 40 MCG/ML (10ML) SYRINGE FOR IV PUSH (FOR BLOOD PRESSURE SUPPORT)
PREFILLED_SYRINGE | INTRAVENOUS | Status: DC | PRN
  Administered 2021-02-22 (×2): 80 ug via INTRAVENOUS

## 2021-02-22 MED ORDER — SODIUM CHLORIDE 0.9 % IV SOLN: Freq: Once | INTRAVENOUS | Status: AC

## 2021-02-22 MED ORDER — ACETAMINOPHEN 325 MG PO TABS: 325.0000 mg | ORAL_TABLET | ORAL | Status: DC | PRN

## 2021-02-22 MED ORDER — ACETAMINOPHEN 160 MG/5ML PO SOLN: 325.0000 mg | ORAL | Status: DC | PRN

## 2021-02-22 MED ORDER — ROCURONIUM BROMIDE 10 MG/ML (PF) SYRINGE
PREFILLED_SYRINGE | INTRAVENOUS | Status: AC
  Filled 2021-02-22: qty 10

## 2021-02-22 MED ORDER — SODIUM CHLORIDE 0.9 % IR SOLN
Status: DC | PRN
  Administered 2021-02-22: 1000 mL

## 2021-02-22 MED ORDER — LIDOCAINE 2% (20 MG/ML) 5 ML SYRINGE
INTRAMUSCULAR | Status: DC | PRN
  Administered 2021-02-22: 100 mg via INTRAVENOUS

## 2021-02-22 MED ORDER — MIDAZOLAM HCL 2 MG/2ML IJ SOLN
INTRAMUSCULAR | Status: DC | PRN
  Administered 2021-02-22: 2 mg via INTRAVENOUS

## 2021-02-22 MED ORDER — DEXAMETHASONE SODIUM PHOSPHATE 10 MG/ML IJ SOLN
INTRAMUSCULAR | Status: DC | PRN
Start: 2021-02-22 — End: 2021-02-22
  Administered 2021-02-22: 5 mg via INTRAVENOUS

## 2021-02-22 MED ORDER — DEXAMETHASONE SODIUM PHOSPHATE 10 MG/ML IJ SOLN
INTRAMUSCULAR | Status: AC
  Filled 2021-02-22: qty 1

## 2021-02-22 SURGICAL SUPPLY — 43 items
APPLIER CLIP 5 13 M/L LIGAMAX5 (MISCELLANEOUS)
APPLIER CLIP ROT 10 11.4 M/L (STAPLE)
BAG COUNTER SPONGE SURGICOUNT (BAG) ×2 IMPLANT
CANISTER SUCT 3000ML PPV (MISCELLANEOUS) ×2 IMPLANT
CHLORAPREP W/TINT 26 (MISCELLANEOUS) ×2 IMPLANT
CLIP APPLIE 5 13 M/L LIGAMAX5 (MISCELLANEOUS) IMPLANT
CLIP APPLIE ROT 10 11.4 M/L (STAPLE) IMPLANT
CNTNR URN SCR LID CUP LEK RST (MISCELLANEOUS) ×1 IMPLANT
CONT SPEC 4OZ STRL OR WHT (MISCELLANEOUS) ×1
COVER SURGICAL LIGHT HANDLE (MISCELLANEOUS) ×2 IMPLANT
CUTTER ENDO LINEAR 45M (STAPLE) ×2 IMPLANT
CUTTER FLEX LINEAR 45M (STAPLE) ×2 IMPLANT
DERMABOND ADVANCED (GAUZE/BANDAGES/DRESSINGS) ×1
DERMABOND ADVANCED .7 DNX12 (GAUZE/BANDAGES/DRESSINGS) ×1 IMPLANT
ELECT REM PT RETURN 9FT ADLT (ELECTROSURGICAL) ×2
ELECTRODE REM PT RTRN 9FT ADLT (ELECTROSURGICAL) ×1 IMPLANT
GAUZE 4X4 16PLY ~~LOC~~+RFID DBL (SPONGE) ×2 IMPLANT
GLOVE SURG SIGNA 7.5 PF LTX (GLOVE) ×2 IMPLANT
GOWN STRL REUS W/ TWL LRG LVL3 (GOWN DISPOSABLE) ×2 IMPLANT
GOWN STRL REUS W/ TWL XL LVL3 (GOWN DISPOSABLE) ×1 IMPLANT
GOWN STRL REUS W/TWL LRG LVL3 (GOWN DISPOSABLE) ×2
GOWN STRL REUS W/TWL XL LVL3 (GOWN DISPOSABLE) ×1
KIT BASIN OR (CUSTOM PROCEDURE TRAY) ×2 IMPLANT
KIT TURNOVER KIT B (KITS) ×2 IMPLANT
NS IRRIG 1000ML POUR BTL (IV SOLUTION) ×2 IMPLANT
PAD ARMBOARD 7.5X6 YLW CONV (MISCELLANEOUS) ×4 IMPLANT
POUCH SPECIMEN RETRIEVAL 10MM (ENDOMECHANICALS) ×2 IMPLANT
RELOAD 45 VASCULAR/THIN (ENDOMECHANICALS) IMPLANT
RELOAD STAPLE TA45 3.5 REG BLU (ENDOMECHANICALS) IMPLANT
SET IRRIG TUBING LAPAROSCOPIC (IRRIGATION / IRRIGATOR) ×2 IMPLANT
SET TUBE SMOKE EVAC HIGH FLOW (TUBING) ×2 IMPLANT
SHEARS HARMONIC ACE PLUS 36CM (ENDOMECHANICALS) ×2 IMPLANT
SLEEVE ENDOPATH XCEL 5M (ENDOMECHANICALS) ×2 IMPLANT
SPECIMEN JAR SMALL (MISCELLANEOUS) ×2 IMPLANT
SPONGE T-LAP 18X18 ~~LOC~~+RFID (SPONGE) ×2 IMPLANT
SUT MON AB 4-0 PC3 18 (SUTURE) ×2 IMPLANT
SYR BULB IRRIG 60ML STRL (SYRINGE) ×2 IMPLANT
TOWEL GREEN STERILE (TOWEL DISPOSABLE) ×2 IMPLANT
TOWEL GREEN STERILE FF (TOWEL DISPOSABLE) ×2 IMPLANT
TRAY LAPAROSCOPIC MC (CUSTOM PROCEDURE TRAY) ×2 IMPLANT
TROCAR XCEL BLUNT TIP 100MML (ENDOMECHANICALS) ×2 IMPLANT
TROCAR XCEL NON-BLD 5MMX100MML (ENDOMECHANICALS) ×2 IMPLANT
WATER STERILE IRR 1000ML POUR (IV SOLUTION) ×2 IMPLANT

## 2021-02-22 NOTE — ED Notes (Addendum)
Per MD Stechschulte can change pt fluids to normal saline which is compatible with LR.

## 2021-02-22 NOTE — Discharge Summary (Signed)
    Patient ID: Rebecca English [id number] 1986-04-16 35 y.o.  Admit date: 02/21/2021 Discharge date: 02/22/2021  Admitting Diagnosis: Acute Appendicitis   Discharge Diagnosis Acute Appendicitis   Consultants None  H&P: Rebecca English is an 35 y.o. female who is here for abdominal pain.   She had pain all over her abdomen.  It started this morning around 9 AM.  It was so severe it brought her into the hospital.  She hasn't had much of an appetite.  She has had some nausea.  Imaging: CT scan with acute appendicitis   Procedures Dr. Magnus Ivan - Laparoscopic Appendectomy - 02/22/2021  Hospital Course:  Patient was admitted for acute appendicitis and underwent laparoscopic appendectomy by Dr. Magnus Ivan. Patient was discharged from the PACU. Follow up as noted below.   I was not directly involved in this patient's care and did not see the patient during their hospital stay, therefore the information in this discharge summary was taken entirely from the chart.    Allergies as of 02/22/2021   No Known Allergies      Medication List     STOP taking these medications    aspirin EC 81 MG tablet       TAKE these medications    acetaminophen 325 MG tablet Commonly known as: TYLENOL Take 2 tablets (650 mg total) by mouth every 6 (six) hours as needed for mild pain.   ibuprofen 200 MG tablet Commonly known as: ADVIL Take 200 mg by mouth every 6 (six) hours as needed for headache or moderate pain. What changed: Another medication with the same name was removed. Continue taking this medication, and follow the directions you see here.   oxyCODONE 5 MG immediate release tablet Commonly known as: Oxy IR/ROXICODONE Take 1 tablet (5 mg total) by mouth every 6 (six) hours as needed for breakthrough pain.   polyethylene glycol 17 g packet Commonly known as: MiraLax Take 17 g by mouth daily as needed for mild constipation.          Follow-up Information      [facility] Follow up on 03/16/2021.   Specialty: General Surgery Why: 11:15am. Please call to confirm your appointment time. Please bring a copy of your photo ID and insurance card. Contact information: [address] [place] [address] [phone]                 Signed: Leary Roca, [facility] 02/22/2021, 11:24 AM Please see Amion for pager number during day hours 7:00am-4:30pm

## 2021-02-22 NOTE — Transfer of Care (Signed)
Immediate Anesthesia Transfer of Care Note  Patient: Rebecca English  Procedure(s) Performed: APPENDECTOMY LAPAROSCOPIC (Abdomen)  Patient Location: PACU  Anesthesia Type:General  Level of Consciousness: sedated  Airway & Oxygen Therapy: Patient Spontanous Breathing and Patient connected to nasal cannula oxygen  Post-op Assessment: Report given to RN  Post vital signs: Reviewed and stable  Last Vitals:  Vitals Value Taken Time  BP 99/59 02/22/21 0831  Temp    Pulse 71 02/22/21 0831  Resp 9 02/22/21 0831  SpO2 100 % 02/22/21 0831  Vitals shown include unvalidated device data.  Last Pain:  Vitals:   02/22/21 0609  TempSrc: Oral  PainSc:          Complications: No notable events documented.

## 2021-02-22 NOTE — Progress Notes (Signed)
Patient ID: Rebecca English, female   DOB: 07-22-1985, 35 y.o.   MRN: [id number]   Pre Procedure note for inpatients:   Rebecca English has been scheduled for Procedure(s): APPENDECTOMY LAPAROSCOPIC (N/A) today. The various methods of treatment have been discussed with the patient. After consideration of the risks, benefits and treatment options the patient has consented to the planned procedure.   The patient has been seen and labs reviewed. There are no changes in the patient's condition to prevent proceeding with the planned procedure today.  Recent labs:  Lab Results  Component Value Date   WBC 12.6 (H) 02/22/2021   HGB 11.9 (L) 02/22/2021   HCT 35.8 (L) 02/22/2021   PLT 357 02/22/2021   GLUCOSE 99 02/22/2021   ALT 46 (H) 02/21/2021   AST 34 02/21/2021   NA 137 02/22/2021   K 3.4 (L) 02/22/2021   CL 103 02/22/2021   CREATININE 0.45 02/22/2021   BUN 5 (L) 02/22/2021   CO2 26 02/22/2021    Abigail Miyamoto, MD 02/22/2021 7:07 AM

## 2021-02-22 NOTE — Discharge Instructions (Addendum)
CIRUGIA LAPAROSCOPICA: INSTRUCCIONES DE POST OPERATORIO.  Revise siempre los documentos que le entreguen en el lugar donde se ha hecho la cirugia.  SI USTED NECESITA DOCUMENTOS DE INCAPACIDAD (DISABLE) O DE PERMISO FAMILAR (FAMILY LEAVE) NECESITA TRAERLOS A LA OFICINA PARA QUE SEAN PROCESADOS. NO  SE LOS DE A SU DOCTOR. A su alta del hospital se le dara una receta para controlar el dolor. Tomela como ha sido recetada, si la necesita. Si no la necesita puede tomar, Acetaminofen (Tylenol) o Ibuprofen (Advil) para aliviar dolor moderado. Continue tomando el resto de sus medicinas. Si necesita rellenar la receta, llame a la farmacia. ellos contactan a nuestra oficina pidiendo autorizacion. Este tipo de receta no pueden ser rellenadas despues de las  5pm o durante los fines de semana. Con relacion a la dieta: debe ser ligera los primeros dias despues que llege a la casa. Ejemplo: sopas y galleticas. Tome bastante liquido esos dias. La mayoria de los pacientes padecen de inflamacion y cambio de coloracion de la piel alrededor de las incisiones. esto toma dias en resolver.  pnerse una bolsa de hielo en el area affectada ayuda..  Es comun tambien tener un poco de estrenimiento si esta tomado medicinas para el dolor. incremente la cantidad de liquidos a tomar y puede tomar (Colace) esto previene el problema. Si ya tiene estrenimiento, es decir no ha defecado en 48 horas, puede tomar un laxativo (Milk of Magnesia or Miralax) uselo como el paquete le explica.  A menos que se le diga algo diferente. Remueva el bendaje a las 24-48 horas despues dela cirugia. y puede banarse en la ducha sin ningun problema. usted puede tener steri-strips (pequenas curitas transparentes en la piel puesta encima de la incision)  Estas banditas strips should be left on the skin for 7-10 days.   Si su cirujano puso pegamento encima de la incision usted puede banarse bajo la ducha en 24 horas. Este pegamento empezara a caerse en las  proximas 2-3 semanas. Si le pusieron suturas o presillas (grapos) estos seran quitados en su proxima cita en la oficina. . ACTIVIDADES:  Puede hacer actividad ligera.  Como caminar , subir escaleras y poco a poco irlas incrementando tanto como las tolere. Puede tener relaciones sexuales cuando sea comfortable. No carge objetos pesados o haga esfuerzos que no sean aprovados por su doctor. Puede manejar en cuanto no esta tomando medicamentos fuertes (narcoticos) para el dolor, pueda abrochar confortablemente el cinturon de seguridad, y pueda maniobrar y usar los pedales de su vehiculo con seguridad. PUEDE REGRESAR A TRABAJAR  Debe ver a su doctor para una cita de seguimiento en 2-3 semanas despues de la cirugia.  OTRAS ISNSTRUCCIONES:___________________________________________________________________________________ CUANDO LLAMAR A SU MEDICO: FIEBRE mayor de  101.0 No produccion de orina. Sangramiento continue de la herida Incremento de dolor, enrojecimientio o drenaje de la herida (incision) Incremento de dolor abdominal.  The clinic staff is available to answer your questions during regular business hours.  Please don't hesitate to call and ask to speak to one of the nurses for clinical concerns.  If you have a medical emergency, go to the nearest emergency room or call 911.  A surgeon from [facility] is always on call at the hospital. [address], [address], [place], [place]  [address] ? [address] [address], [place], [place]   [address] [phone] ? [phone] ? FAX [phone] Web site: [email or web address]  

## 2021-02-22 NOTE — Progress Notes (Signed)
Patient would like to stay and eat lunch tray. Discharge instructions have been reviewed with patient and daughter. No monitoring required at this point.

## 2021-02-22 NOTE — Anesthesia Preprocedure Evaluation (Signed)
Anesthesia Evaluation  Patient identified by MRN, date of birth, ID band Patient awake    Reviewed: Allergy & Precautions, H&P , NPO status , Patient's Chart, lab work & pertinent test results, reviewed documented beta blocker date and time   Airway Mallampati: I  TM Distance: >3 FB Neck ROM: full    Dental no notable dental hx. (+) Teeth Intact, Dental Advisory Given   Pulmonary neg pulmonary ROS,    Pulmonary exam normal breath sounds clear to auscultation       Cardiovascular Exercise Tolerance: Good negative cardio ROS   Rhythm:regular Rate:Normal     Neuro/Psych negative neurological ROS  negative psych ROS   GI/Hepatic negative GI ROS, Neg liver ROS,   Endo/Other  negative endocrine ROS  Renal/GU negative Renal ROS  negative genitourinary   Musculoskeletal   Abdominal   Peds  Hematology negative hematology ROS (+)   Anesthesia Other Findings   Reproductive/Obstetrics negative OB ROS                             Anesthesia Physical Anesthesia Plan  ASA: 1 and emergent  Anesthesia Plan: General   Post-op Pain Management:    Induction: Intravenous and Cricoid pressure planned  PONV Risk Score and Plan: 3 and Ondansetron and Dexamethasone  Airway Management Planned: Oral ETT  Additional Equipment: None  Intra-op Plan:   Post-operative Plan: Extubation in OR  Informed Consent: I have reviewed the patients History and Physical, chart, labs and discussed the procedure including the risks, benefits and alternatives for the proposed anesthesia with the patient or authorized representative who has indicated his/her understanding and acceptance.     Dental Advisory Given  Plan Discussed with: CRNA and Anesthesiologist  Anesthesia Plan Comments: (  )        Anesthesia Quick Evaluation

## 2021-02-22 NOTE — Op Note (Signed)
Appendectomy, Lap, Procedure Note  Indications: The patient presented with a history of right-sided abdominal pain. A CT revealed findings consistent with acute appendicitis.  Pre-operative Diagnosis: acute appendicitis  Post-operative Diagnosis: Same  Surgeon: Abigail Miyamoto   Assistants: 0  Anesthesia: General endotracheal anesthesia  ASA Class: 1  Procedure Details  The patient was seen again in the Holding Room. The risks, benefits, complications, treatment options, and expected outcomes were discussed with the patient and/or family. The possibilities of reaction to medication, perforation of viscus, bleeding, recurrent infection, finding a normal appendix, the need for additional procedures, failure to diagnose a condition, and creating a complication requiring transfusion or operation were discussed. There was concurrence with the proposed plan and informed consent was obtained. The site of surgery was properly noted. The patient was taken to Operating Room, identified as Rebecca English and the procedure verified as Appendectomy. A Time Out was held and the above information confirmed.  The patient was placed in the supine position and general anesthesia was induced, along with placement of orogastric tube, Venodyne boots, and a Foley catheter. The abdomen was prepped and draped in a sterile fashion. A one centimeter infraumbilical incision was made.  The midline fascia was incised with a #15 blade.  A Kelly clamp was used to confirm entrance into the peritoneal cavity.  A pursestring suture was passed around the incision with a 0 Vicryl.  The Hasson was introduced into the abdomen and the tails of the suture were used to hold the Hasson in place.   The pneumoperitoneum was then established to steady pressure of 15 mmHg.  Additional 5 mm cannulas then placed in the left lower quadrant of the abdomen and the right upper quadrant under direct visualization. A careful evaluation of  the entire abdomen was carried out. The patient was placed in Trendelenburg and left lateral decubitus position. The small intestines were retracted in the cephalad and left lateral direction away from the pelvis and right lower quadrant. The patient was found to have an enlarged and inflamed appendix that was extending into the pelvis. There was no evidence of perforation.  The appendix was carefully dissected. The appendix was was skeletonized with the harmonic scalpel.   The appendix was divided at its base using an endo-GIA stapler. Minimal appendiceal stump was left in place. There was no evidence of bleeding, leakage, or complication after division of the appendix. Irrigation was also performed and irrigate suctioned from the abdomen as well.  The umbilical port site was closed with the purse string suture. There was no residual palpable fascial defect.  The trocar site skin wounds were closed with 4-0 Monocryl.  Instrument, sponge, and needle counts were correct at the conclusion of the case.   Findings: The appendix was found to be inflamed. There were not signs of necrosis.  There was not perforation. There was not abscess formation.  Estimated Blood Loss:  Minimal         Drains: none         Complications:  None; patient tolerated the procedure well.         Disposition: PACU - hemodynamically stable.         Condition: stable

## 2021-02-22 NOTE — Anesthesia Postprocedure Evaluation (Signed)
Anesthesia Post Note  Patient: Rebecca English  Procedure(s) Performed: APPENDECTOMY LAPAROSCOPIC (Abdomen)     Patient location during evaluation: PACU Anesthesia Type: General Level of consciousness: awake and alert Pain management: pain level controlled Vital Signs Assessment: post-procedure vital signs reviewed and stable Respiratory status: spontaneous breathing, nonlabored ventilation, respiratory function stable and patient connected to nasal cannula oxygen Cardiovascular status: blood pressure returned to baseline and stable Postop Assessment: no apparent nausea or vomiting Anesthetic complications: no   No notable events documented.  Last Vitals:  Vitals:   02/22/21 1005 02/22/21 1020  BP: 105/70 106/63  Pulse: 69 70  Resp: 14 12  Temp:  36.8 C  SpO2: 97% 97%    Last Pain:  Vitals:   02/22/21 1020  TempSrc:   PainSc: 4                  Axton Cihlar

## 2021-02-22 NOTE — Anesthesia Procedure Notes (Signed)
Procedure Name: Intubation Date/Time: 02/22/2021 7:41 AM Performed by: Asher Muir, CRNA Pre-anesthesia Checklist: Patient identified, Emergency Drugs available, Suction available and Patient being monitored Patient Re-evaluated:Patient Re-evaluated prior to induction Oxygen Delivery Method: Circle system utilized Preoxygenation: Pre-oxygenation with 100% oxygen Induction Type: IV induction and Cricoid Pressure applied Ventilation: Mask ventilation without difficulty Laryngoscope Size: Mac and 3 Grade View: Grade I Tube size: 7.0 mm Number of attempts: 1 Placement Confirmation: ETT inserted through vocal cords under direct vision, positive ETCO2 and breath sounds checked- equal and bilateral Secured at: 22 cm Tube secured with: Tape Dental Injury: Teeth and Oropharynx as per pre-operative assessment

## 2021-02-23 ENCOUNTER — Encounter (HOSPITAL_COMMUNITY): Payer: Self-pay | Admitting: Surgery

## 2021-02-23 LAB — SURGICAL PATHOLOGY
